# Patient Record
Sex: Male | Born: 1960 | Race: White | Hispanic: No | Marital: Single | Smoking: Current every day smoker
Health system: Southern US, Community
[De-identification: ages and names within clinical notes are randomized; demographics above are authoritative.]

## PROBLEM LIST (undated history)

## (undated) DIAGNOSIS — G4730 Sleep apnea, unspecified: Secondary | ICD-10-CM

## (undated) DIAGNOSIS — F419 Anxiety disorder, unspecified: Secondary | ICD-10-CM

## (undated) DIAGNOSIS — E039 Hypothyroidism, unspecified: Secondary | ICD-10-CM

## (undated) DIAGNOSIS — G8929 Other chronic pain: Secondary | ICD-10-CM

## (undated) DIAGNOSIS — J449 Chronic obstructive pulmonary disease, unspecified: Secondary | ICD-10-CM

## (undated) DIAGNOSIS — I10 Essential (primary) hypertension: Secondary | ICD-10-CM

## (undated) DIAGNOSIS — I700 Atherosclerosis of aorta: Secondary | ICD-10-CM

## (undated) DIAGNOSIS — E785 Hyperlipidemia, unspecified: Secondary | ICD-10-CM

## (undated) HISTORY — PX: CHOLECYSTECTOMY: SHX55

## (undated) HISTORY — DX: Hypothyroidism, unspecified: E03.9

## (undated) HISTORY — PX: HERNIA REPAIR: SHX51

## (undated) HISTORY — PX: APPENDECTOMY: SHX54

## (undated) HISTORY — DX: Atherosclerosis of aorta: I70.0

## (undated) HISTORY — DX: Anxiety disorder, unspecified: F41.9

## (undated) HISTORY — PX: MOUTH SURGERY: SHX715

## (undated) HISTORY — DX: Hyperlipidemia, unspecified: E78.5

---

## 2009-01-24 ENCOUNTER — Emergency Department: Payer: Self-pay | Admitting: Emergency Medicine

## 2016-07-05 HISTORY — PX: SKIN CANCER EXCISION: SHX779

## 2018-11-13 DIAGNOSIS — M5416 Radiculopathy, lumbar region: Secondary | ICD-10-CM | POA: Insufficient documentation

## 2018-11-13 DIAGNOSIS — M546 Pain in thoracic spine: Secondary | ICD-10-CM | POA: Insufficient documentation

## 2020-08-26 ENCOUNTER — Emergency Department: Payer: 59

## 2020-08-26 ENCOUNTER — Other Ambulatory Visit: Payer: Self-pay

## 2020-08-26 ENCOUNTER — Emergency Department
Admission: EM | Admit: 2020-08-26 | Discharge: 2020-08-26 | Discharge status: Against Medical Advice | Payer: 59 | Attending: Emergency Medicine | Admitting: Emergency Medicine

## 2020-08-26 DIAGNOSIS — W1839XA Other fall on same level, initial encounter: Secondary | ICD-10-CM | POA: Diagnosis not present

## 2020-08-26 DIAGNOSIS — R55 Syncope and collapse: Secondary | ICD-10-CM | POA: Diagnosis not present

## 2020-08-26 DIAGNOSIS — I10 Essential (primary) hypertension: Secondary | ICD-10-CM | POA: Insufficient documentation

## 2020-08-26 DIAGNOSIS — R778 Other specified abnormalities of plasma proteins: Secondary | ICD-10-CM | POA: Diagnosis not present

## 2020-08-26 DIAGNOSIS — F17200 Nicotine dependence, unspecified, uncomplicated: Secondary | ICD-10-CM | POA: Insufficient documentation

## 2020-08-26 HISTORY — DX: Essential (primary) hypertension: I10

## 2020-08-26 LAB — CBC WITH DIFFERENTIAL/PLATELET
Abs Immature Granulocytes: 0.09 10*3/uL — ABNORMAL HIGH (ref 0.00–0.07)
Basophils Absolute: 0.1 10*3/uL (ref 0.0–0.1)
Basophils Relative: 0 %
Eosinophils Absolute: 0.1 10*3/uL (ref 0.0–0.5)
Eosinophils Relative: 1 %
HCT: 49.8 % (ref 39.0–52.0)
Hemoglobin: 17.4 g/dL — ABNORMAL HIGH (ref 13.0–17.0)
Immature Granulocytes: 1 %
Lymphocytes Relative: 8 %
Lymphs Abs: 1.2 10*3/uL (ref 0.7–4.0)
MCH: 31.8 pg (ref 26.0–34.0)
MCHC: 34.9 g/dL (ref 30.0–36.0)
MCV: 90.9 fL (ref 80.0–100.0)
Monocytes Absolute: 1.2 10*3/uL — ABNORMAL HIGH (ref 0.1–1.0)
Monocytes Relative: 8 %
Neutro Abs: 11.7 10*3/uL — ABNORMAL HIGH (ref 1.7–7.7)
Neutrophils Relative %: 82 %
Platelets: 173 10*3/uL (ref 150–400)
RBC: 5.48 MIL/uL (ref 4.22–5.81)
RDW: 13.5 % (ref 11.5–15.5)
WBC: 14.4 10*3/uL — ABNORMAL HIGH (ref 4.0–10.5)
nRBC: 0 % (ref 0.0–0.2)

## 2020-08-26 LAB — TROPONIN I (HIGH SENSITIVITY)
Troponin I (High Sensitivity): 17 ng/L (ref <18)
Troponin I (High Sensitivity): 23 ng/L — ABNORMAL HIGH (ref <18)

## 2020-08-26 LAB — URINALYSIS, COMPLETE (UACMP) WITH MICROSCOPIC
Bacteria, UA: NONE SEEN
Bilirubin Urine: NEGATIVE
Glucose, UA: NEGATIVE mg/dL
Ketones, ur: 5 mg/dL — AB
Leukocytes,Ua: NEGATIVE
Nitrite: NEGATIVE
Protein, ur: NEGATIVE mg/dL
Specific Gravity, Urine: 1.009 (ref 1.005–1.030)
Squamous Epithelial / HPF: NONE SEEN (ref 0–5)
pH: 7 (ref 5.0–8.0)

## 2020-08-26 LAB — COMPREHENSIVE METABOLIC PANEL
ALT: 14 U/L (ref 0–44)
AST: 22 U/L (ref 15–41)
Albumin: 3.7 g/dL (ref 3.5–5.0)
Alkaline Phosphatase: 41 U/L (ref 38–126)
Anion gap: 8 (ref 5–15)
BUN: 19 mg/dL (ref 8–23)
CO2: 23 mmol/L (ref 22–32)
Calcium: 8.7 mg/dL — ABNORMAL LOW (ref 8.9–10.3)
Chloride: 108 mmol/L (ref 98–111)
Creatinine, Ser: 1.06 mg/dL (ref 0.61–1.24)
GFR, Estimated: >60 mL/min (ref >60)
Glucose, Bld: 114 mg/dL — ABNORMAL HIGH (ref 70–99)
Potassium: 3.2 mmol/L — ABNORMAL LOW (ref 3.5–5.1)
Sodium: 139 mmol/L (ref 135–145)
Total Bilirubin: 1.1 mg/dL (ref 0.3–1.2)
Total Protein: 6.6 g/dL (ref 6.5–8.1)

## 2020-08-26 MED ORDER — SODIUM CHLORIDE 0.9 % IV BOLUS
500.0000 mL | Freq: Once | INTRAVENOUS | Status: AC
  Administered 2020-08-26: 500 mL via INTRAVENOUS

## 2020-08-26 NOTE — ED Notes (Signed)
Pt diaphoretic upon touch but not obviously visible. Was reported diaphoretic on scene with EMS.

## 2020-08-26 NOTE — ED Notes (Signed)
Pt leaving for CT.  

## 2020-08-26 NOTE — Discharge Instructions (Addendum)
Return to the ER for new, worsening, or persistent severe weakness, dizziness, lightheadedness, recurrent episodes of passing out, any chest pain, difficulty breathing, palpitations, or any other new or worsening symptoms that concern you.  You also may return at anytime if you change your mind and wish to resume your work-up in the emergency department and be admitted.  Your work-up today showed a slightly elevated troponin.  This is an enzyme release by the heart muscle if it is under strain.  You also had an elevated white blood cell count which can be a response to an infection, inflammation, or other stress on the body.  As discussed, you should start to increase your activity level gradually including walking around your home, basic stretches, short trips outside, and progressing to longer walks.  We recommend that you follow-up with your primary care doctor and with a cardiologist.  A referral has been provided.

## 2020-08-26 NOTE — ED Triage Notes (Signed)
Pt in with syncopal episode x2 and seizure-like activity per EMS [place] at mall. 18g R fa placed by EMS. NSR; VSS with EMS; RA; BG 109 with EMS. Pt currently A&Ox4. Resp reg/unlabored. Pt's face flushed. Skin dry.

## 2020-08-26 NOTE — ED Notes (Signed)
Bed locked low. Rails up. Call bell within reach. Urinal attached to bedrail. Pt understands urine sample needed when possible.

## 2020-08-26 NOTE — ED Provider Notes (Signed)
[facility] Emergency Department Provider Note ____________________________________________   Event Date/Time   First MD Initiated Contact with Patient 08/26/20 1628     (approximate)  I have reviewed the triage vital signs and the nursing notes.   HISTORY  Chief Complaint Near Syncope    HPI Isaiah Brown is a 60 y.o. male with PMH as noted below who presents with 2 episodes of syncope.  The patient was shopping at [facility] and states that he started to feel lightheaded and weak.  He tried to get some cold air at one of the freezers, but then continued to feel lightheaded and tried to leave the store.  He then passed out.  He states that he fell to the ground but did not hit his head.  He scratches right hand but denies other injuries.  EMS reports that the patient then had a second syncopal episode in their presence possibly with brief seizure-like activity.  The patient states that he now feels tired but otherwise well.  He states he had an episode like this approximately 6 months ago.  He states that he felt relatively well this morning and yesterday.  He denies any changes in routine, other than not usually going out to a store; he states that because of a lumbar spine problem he has not really been moving around much and it is rare for him to go out.   Past Medical History:  Diagnosis Date  . Hypertension     There are no problems to display for this patient.   History reviewed. No pertinent surgical history.  Prior to Admission medications   Not on File    Allergies Patient has no allergy information on record.  No family history on file.  Social History Social History   Tobacco Use  . Smoking status: Current Every Day Smoker  . Smokeless tobacco: Never Used  Substance Use Topics  . Alcohol use: Not Currently  . Drug use: Never    Review of Systems  Constitutional: No fever. Eyes: No visual changes. ENT: No sore throat.  Positive  for neck pain. Cardiovascular: Denies chest pain. Respiratory: Denies shortness of breath. Gastrointestinal: No vomiting or diarrhea.  Genitourinary: Negative for dysuria.  Musculoskeletal: Negative for back pain. Skin: Negative for rash. Neurological: Negative for headaches, focal weakness or numbness.   ____________________________________________   PHYSICAL EXAM:  VITAL SIGNS: ED Triage Vitals  Enc Vitals Group     BP 08/26/20 1610 (!) 143/87     Pulse Rate 08/26/20 1610 85     Resp 08/26/20 1610 12     Temp 08/26/20 1617 98.3 F (36.8 C)     Temp Source 08/26/20 1617 Oral     SpO2 08/26/20 1610 96 %     Weight 08/26/20 1619 275 lb (124.7 kg)     Height 08/26/20 1619 6\' 1"  (1.854 m)     Head Circumference --      Peak Flow --      Pain Score 08/26/20 1618 3     Pain Loc --      Pain Edu? --      Excl. in GC? --     Constitutional: Alert and oriented.  Somewhat weak appearing but in no acute distress. Eyes: Conjunctivae are normal.  EOMI.  PERRLA. Head: Atraumatic. Nose: No congestion/rhinnorhea. Mouth/Throat: Mucous membranes are moist.   Neck: Normal range of motion.  Mild midline and bilateral paraspinal tenderness. Cardiovascular: Normal rate, regular rhythm.  Good peripheral  circulation. Respiratory: Normal respiratory effort.  No retractions.  Gastrointestinal: Soft and nontender. No distention.  Genitourinary: No flank tenderness. Musculoskeletal: No lower extremity edema.  Extremities warm and well perfused.  Neurologic:  Normal speech and language.  Motor intact in all extremities.  Normal coordination Skin:  Skin is warm and dry. No rash noted. Psychiatric: Mood and affect are normal. Speech and behavior are normal.  ____________________________________________   LABS (all labs ordered are listed, but only abnormal results are displayed)  Labs Reviewed  COMPREHENSIVE METABOLIC PANEL - Abnormal; Notable for the following components:      Result  Value   Potassium 3.2 (*)    Glucose, Bld 114 (*)    Calcium 8.7 (*)    All other components within normal limits  CBC WITH DIFFERENTIAL/PLATELET - Abnormal; Notable for the following components:   WBC 14.4 (*)    Hemoglobin 17.4 (*)    Neutro Abs 11.7 (*)    Monocytes Absolute 1.2 (*)    Abs Immature Granulocytes 0.09 (*)    All other components within normal limits  URINALYSIS, COMPLETE (UACMP) WITH MICROSCOPIC - Abnormal; Notable for the following components:   Color, Urine YELLOW (*)    APPearance CLEAR (*)    Hgb urine dipstick MODERATE (*)    Ketones, ur 5 (*)    All other components within normal limits  TROPONIN I (HIGH SENSITIVITY) - Abnormal; Notable for the following components:   Troponin I (High Sensitivity) 23 (*)    All other components within normal limits  TROPONIN I (HIGH SENSITIVITY)   ____________________________________________  EKG  ED ECG REPORT I, Dionne Bucy, the attending physician, personally viewed and interpreted this ECG.  Date: 08/26/2020 EKG Time: 1613 Rate: 90 Rhythm: normal sinus rhythm QRS Axis: normal Intervals: Incomplete RBBB, LAFB ST/T Wave abnormalities: normal Narrative Interpretation: no evidence of acute ischemia  ____________________________________________  RADIOLOGY  CT head: No ICH or other acute abnormality CT cervical spine: No acute fracture ____________________________________________   PROCEDURES  Procedure(s) performed: No  Procedures  Critical Care performed: No ____________________________________________   INITIAL IMPRESSION / ASSESSMENT AND PLAN / ED COURSE  Pertinent labs & imaging results that were available during my care of the patient were reviewed by me and considered in my medical decision making (see chart for details).  60 year old male with PMH as noted above as well as a history of lumbar spondylolisthesis for which he is currently being worked up by orthopedics presents after 2  episodes of syncope while he was at [place], with a prodrome of lightheadedness and generalized weakness.  The second episode was witnessed by EMS and was brief.  There was possibly some momentary seizure-like activity.  I reviewed the past medical records in Epic and Care Everywhere.  The patient has no prior ED visits or admissions here.  He is currently being evaluated by orthopedics for lumbar spondylolisthesis.  On exam currently, the patient is somewhat tired appearing but in no acute distress.  His vital signs are normal.  Neurologic exam is nonfocal.  The remainder of physical exam is unremarkable.  EKG shows no acute abnormalities.  Differential includes vasovagal episode, dehydration, hypovolemia, electrolyte abnormality, or other sequela of generalized deconditioning, other metabolic cause, or less likely primary cardiac or neurologic etiology.  I have a low suspicion for actual seizure given the lack of any postictal phase.  We will obtain a CT head and cervical spine, lab work-up, give a fluid bolus, and reassess.  ----------------------------------------- 7:53 PM on  08/26/2020 -----------------------------------------  The patient's initial lab work-up was unremarkable except for leukocytosis of unclear etiology.  His urinalysis is normal and there is no evidence of pneumonia or other specific source of infection.  CT head and cervical spine both showed no acute abnormalities.  Repeat troponin was slightly elevated.  Overall, this is of unclear significance.  The patient has no EKG changes, chest pain, or other symptoms to suggest ACS.  I recommended admission for trending of the troponin, cardiac monitoring, ACS rule out, although my suspicion for acute cardiac issue is low.  However, the patient states that he does not want to be admitted to the hospital if at all possible.  I also offered to observe for another few hours and do a third troponin to make sure it is not trending  up further, but he declined this as well.  The patient elects to go home.  I had an extensive discussion with the patient about the possible etiologies of his syncope and about the results of the work-up.  I explained that a rising troponin could indicate damage to the heart, and impending heart attack, or a possible cardiac cause of him passing out.  Risks include heart attack, permanent heart damage, permanent disability and death.  The patient was able to paraphrase [place] back to me.  He demonstrates appropriate understanding, and full decision-making capacity.  He will sign AGAINST MEDICAL ADVICE.  Return precautions given, and he expresses understanding.  He also understands he may return at anytime if he changes his mind and wishes to resume his work-up.    ____________________________________________   FINAL CLINICAL IMPRESSION(S) / ED DIAGNOSES  Final diagnoses:  Syncope, unspecified syncope type  Elevated troponin      NEW MEDICATIONS STARTED DURING THIS VISIT:  There are no discharge medications for this patient.    Note:  This document was prepared using Dragon voice recognition software and may include unintentional dictation errors.   Dionne Bucy, MD 08/26/20 2241

## 2020-10-01 ENCOUNTER — Ambulatory Visit (INDEPENDENT_AMBULATORY_CARE_PROVIDER_SITE_OTHER): Payer: 59 | Admitting: Internal Medicine

## 2020-10-01 ENCOUNTER — Encounter: Payer: Self-pay | Admitting: Internal Medicine

## 2020-10-01 ENCOUNTER — Other Ambulatory Visit: Payer: Self-pay

## 2020-10-01 VITALS — BP 160/98 | HR 84 | Temp 98.9°F | Ht 72.21 in | Wt 263.0 lb

## 2020-10-01 DIAGNOSIS — R55 Syncope and collapse: Secondary | ICD-10-CM

## 2020-10-01 DIAGNOSIS — Z125 Encounter for screening for malignant neoplasm of prostate: Secondary | ICD-10-CM

## 2020-10-01 DIAGNOSIS — T671XXD Heat syncope, subsequent encounter: Secondary | ICD-10-CM

## 2020-10-01 LAB — URINALYSIS, ROUTINE W REFLEX MICROSCOPIC
Bilirubin, UA: NEGATIVE
Glucose, UA: NEGATIVE
Leukocytes,UA: NEGATIVE
Nitrite, UA: NEGATIVE
Specific Gravity, UA: >1.03 — ABNORMAL HIGH (ref 1.005–1.030)
Urobilinogen, Ur: 1 mg/dL (ref 0.2–1.0)
pH, UA: 6 (ref 5.0–7.5)

## 2020-10-01 LAB — MICROSCOPIC EXAMINATION
Bacteria, UA: NONE SEEN
WBC, UA: NONE SEEN /hpf (ref 0–5)

## 2020-10-01 LAB — BAYER DCA HB A1C WAIVED: HB A1C (BAYER DCA - WAIVED): 5 % (ref <7.0)

## 2020-10-01 MED ORDER — NICOTINE 21 MG/24HR TD PT24: 21.0000 mg | MEDICATED_PATCH | Freq: Every day | TRANSDERMAL | 1 refills | Status: DC

## 2020-10-01 NOTE — Progress Notes (Signed)
BP (!) 160/98   Pulse 84   Temp 98.9 F (37.2 C) (Oral)   Ht 6' 0.21" (1.834 m)   Wt 263 lb (119.3 kg)   SpO2 95%   BMI 35.47 kg/m    Subjective:    Patient ID: Isaiah Brown, male    DOB: [birth year], 60 y.o.   MRN: [id number]  HPI: Isaiah Brown is a 61 y.o. male  Pt is New to the practice, was walking while shopping felt lightheaded, jittery legs were wobbly x 08/06/20   [place] moment [place] , started to sweat went to the ER via EMS - was rx muscle relaxants for his back pain has a ho lumbar DDD, Lower back pain , has had Injections in 2020 for such sees [facility] , has been on pain meds is on tramadol and methocarbamol . Has an "addictive personality " per his verbal record - worse was ETOH abuse drank a lot in the past quit 18 yrs ago none since then. Is a current smoker Has some chest tighntess in left arm and left side of chest, not currently of note troponins have been elevated in the ER pt signed out AMA and didn't want to have repeat checks/ Admission to the hosp.    Nicotine Dependence Presents for initial visit. Symptoms are negative for decreased concentration and fatigue. Preferred tobacco types include cigars. His urge triggers include company of smokers. (1 ppd increased to 2-3 ppd) He smokes 3 packs of cigarettes per day. He started smoking when he was 55-55 years old. Isaiah Brown is thinking about quitting.  Loss of Consciousness This is a new (was walking while shopping felt lightheaded, jittery legs were wobbly x 08/06/20   [place] moment [place] , started to sweat went to the ER via EMS - was rx muscle relaxants for his back pain has a ho lumbar DDD,) problem. Pertinent negatives include no abdominal pain, chest pain, confusion, dizziness, fever, headaches, light-headedness, nausea, palpitations, vomiting or weakness.    Chief Complaint  Patient presents with  . New Patient (Initial Visit)    Had 2 episodes of syncope. He felt week and  jittery early in Feb. 22, 2022    Relevant past medical, surgical, family and social history reviewed and updated as indicated. Interim medical history since our last visit reviewed. Allergies and medications reviewed and updated.  Review of Systems  Constitutional: Negative for activity change, appetite change, chills, fatigue and fever.  HENT: Negative for congestion, ear discharge, ear pain and facial swelling.   Eyes: Negative for pain, discharge and itching.  Respiratory: Negative for cough, chest tightness, shortness of breath and wheezing.   Cardiovascular: Negative for chest pain, palpitations and leg swelling.  Gastrointestinal: Negative for abdominal distention, abdominal pain, blood in stool, constipation, diarrhea, nausea and vomiting.  Endocrine: Negative for cold intolerance, heat intolerance, polydipsia, polyphagia and polyuria.  Genitourinary: Negative for difficulty urinating, dysuria, flank pain, frequency, hematuria and urgency.  Musculoskeletal: Negative for arthralgias, gait problem, joint swelling and myalgias.  Skin: Negative for color change, rash and wound.  Neurological: Negative for dizziness, tremors, speech difficulty, weakness, light-headedness, numbness and headaches.  Hematological: Does not bruise/bleed easily.  Psychiatric/Behavioral: Negative for agitation, confusion, decreased concentration, sleep disturbance and suicidal ideas.    Per HPI unless specifically indicated above     Objective:    BP (!) 160/98   Pulse 84   Temp 98.9 F (37.2 C) (Oral)   Ht 6'  0.21" (1.834 m)   Wt 263 lb (119.3 kg)   SpO2 95%   BMI 35.47 kg/m   Wt Readings from Last 3 Encounters:  10/01/20 263 lb (119.3 kg)  08/26/20 275 lb (124.7 kg)    Physical Exam Vitals and nursing note reviewed.  Constitutional:      General: He is not in acute distress.    Appearance: Normal appearance. He is not ill-appearing or diaphoretic.  HENT:     Head: Normocephalic and  atraumatic.     Right Ear: Tympanic membrane and external ear normal. There is no impacted cerumen.     Left Ear: External ear normal.     Nose: No congestion or rhinorrhea.     Mouth/Throat:     Pharynx: No oropharyngeal exudate or posterior oropharyngeal erythema.  Eyes:     Conjunctiva/sclera: Conjunctivae normal.     Pupils: Pupils are equal, round, and reactive to light.  Cardiovascular:     Rate and Rhythm: Normal rate and regular rhythm.     Heart sounds: No murmur heard. No friction rub. No gallop.   Pulmonary:     Effort: No respiratory distress.     Breath sounds: No stridor. No wheezing or rhonchi.  Chest:     Chest wall: No tenderness.  Abdominal:     General: Abdomen is flat. Bowel sounds are normal.     Palpations: Abdomen is soft. There is no mass.     Tenderness: There is no abdominal tenderness.  Musculoskeletal:     Cervical back: Normal range of motion and neck supple. No rigidity or tenderness.     Left lower leg: No edema.  Skin:    General: Skin is warm and dry.  Neurological:     Mental Status: He is alert.     Results for orders placed or performed during the hospital encounter of 08/26/20  Comprehensive metabolic panel  Result Value Ref Range   Sodium 139 135 - 145 mmol/L   Potassium 3.2 (L) 3.5 - 5.1 mmol/L   Chloride 108 98 - 111 mmol/L   CO2 23 22 - 32 mmol/L   Glucose, Bld 114 (H) 70 - 99 mg/dL   BUN 19 8 - 23 mg/dL   Creatinine, Ser 8.67 0.61 - 1.24 mg/dL   Calcium 8.7 (L) 8.9 - 10.3 mg/dL   Total Protein 6.6 6.5 - 8.1 g/dL   Albumin 3.7 3.5 - 5.0 g/dL   AST 22 15 - 41 U/L   ALT 14 0 - 44 U/L   Alkaline Phosphatase 41 38 - 126 U/L   Total Bilirubin 1.1 0.3 - 1.2 mg/dL   GFR, Estimated >67 >20 mL/min   Anion gap 8 5 - 15  CBC with Differential  Result Value Ref Range   WBC 14.4 (H) 4.0 - 10.5 K/uL   RBC 5.48 4.22 - 5.81 MIL/uL   Hemoglobin 17.4 (H) 13.0 - 17.0 g/dL   HCT 94.7 09.6 - 28.3 %   MCV 90.9 80.0 - 100.0 fL   MCH 31.8  26.0 - 34.0 pg   MCHC 34.9 30.0 - 36.0 g/dL   RDW 66.2 94.7 - 65.4 %   Platelets 173 150 - 400 K/uL   nRBC 0.0 0.0 - 0.2 %   Neutrophils Relative % 82 %   Neutro Abs 11.7 (H) 1.7 - 7.7 K/uL   Lymphocytes Relative 8 %   Lymphs Abs 1.2 0.7 - 4.0 K/uL   Monocytes Relative 8 %   Monocytes  Absolute 1.2 (H) 0.1 - 1.0 K/uL   Eosinophils Relative 1 %   Eosinophils Absolute 0.1 0.0 - 0.5 K/uL   Basophils Relative 0 %   Basophils Absolute 0.1 0.0 - 0.1 K/uL   Immature Granulocytes 1 %   Abs Immature Granulocytes 0.09 (H) 0.00 - 0.07 K/uL  Urinalysis, Complete w Microscopic  Result Value Ref Range   Color, Urine YELLOW (A) YELLOW   APPearance CLEAR (A) CLEAR   Specific Gravity, Urine 1.009 1.005 - 1.030   pH 7.0 5.0 - 8.0   Glucose, UA NEGATIVE NEGATIVE mg/dL   Hgb urine dipstick MODERATE (A) NEGATIVE   Bilirubin Urine NEGATIVE NEGATIVE   Ketones, ur 5 (A) NEGATIVE mg/dL   Protein, ur NEGATIVE NEGATIVE mg/dL   Nitrite NEGATIVE NEGATIVE   Leukocytes,Ua NEGATIVE NEGATIVE   RBC / HPF 11-20 0 - 5 RBC/hpf   WBC, UA 0-5 0 - 5 WBC/hpf   Bacteria, UA NONE SEEN NONE SEEN   Squamous Epithelial / LPF NONE SEEN 0 - 5   Mucus PRESENT   Troponin I (High Sensitivity)  Result Value Ref Range   Troponin I (High Sensitivity) 17 <18 ng/L  Troponin I (High Sensitivity)  Result Value Ref Range   Troponin I (High Sensitivity) 23 (H) <18 ng/L        Current Outpatient Medications:  .  celecoxib (CELEBREX) 200 MG capsule, celecoxib 200 mg capsule  TAKE 1 CAPSULE BY MOUTH ONCE DAILY FOR 30 DAYS, Disp: , Rfl:  .  ibuprofen (ADVIL) 200 MG tablet, Take by mouth., Disp: , Rfl:  .  methocarbamol (ROBAXIN) 750 MG tablet, methocarbamol 750 mg tablet  TAKE 1 TO 2 TABLETS BY MOUTH THREE TIMES DAILY, Disp: , Rfl:     Assessment & Plan:  1. Syncope ? Cardiac  Will refer to cards Stop methocarbamol  hasnt used tramadol since rx   2. Nicotine abuse Smoking cessation advised.start pt on nicotine patches in  the past. continues to smoke. more than > 5 - 10 mins of time was spent with pt regarding smoking cessation and complications.  Consider Lung cancer screening with Low dose CT chest.    3. Back pain chronic stable, will need to fu with ortho for such  Continue NSAIDS as rx by ortho  Stop / hold methocarbamol as dose recently increased ?cause of syncope.   4. ETOH abuse in the past LFts wnl from Er Will check   5. Leukocytosis not addressed in ER ? Etiology  Recheck  Consider CXR  Check UA   6. Hb 17 ? Sec to chronic smoking h.o ? MDS will recheck consdier heme onc referal if continues to go higher.    Problem List Items Addressed This Visit   None      Follow up plan: No follow-ups on file.

## 2020-10-02 ENCOUNTER — Ambulatory Visit: Payer: Medicaid Other

## 2020-10-02 ENCOUNTER — Ambulatory Visit (INDEPENDENT_AMBULATORY_CARE_PROVIDER_SITE_OTHER): Payer: 59 | Admitting: Internal Medicine

## 2020-10-02 ENCOUNTER — Encounter: Payer: Self-pay | Admitting: Internal Medicine

## 2020-10-02 VITALS — BP 153/97 | HR 69 | Temp 98.9°F | Ht 72.21 in | Wt 263.0 lb

## 2020-10-02 DIAGNOSIS — R319 Hematuria, unspecified: Secondary | ICD-10-CM

## 2020-10-02 DIAGNOSIS — R7989 Other specified abnormal findings of blood chemistry: Secondary | ICD-10-CM | POA: Diagnosis not present

## 2020-10-02 LAB — COMPREHENSIVE METABOLIC PANEL
ALT: 11 IU/L (ref 0–44)
AST: 22 IU/L (ref 0–40)
Albumin/Globulin Ratio: 1.7 (ref 1.2–2.2)
Albumin: 4.8 g/dL (ref 3.8–4.8)
Alkaline Phosphatase: 73 IU/L (ref 44–121)
BUN/Creatinine Ratio: 13 (ref 10–24)
BUN: 14 mg/dL (ref 8–27)
Bilirubin Total: 0.7 mg/dL (ref 0.0–1.2)
CO2: 16 mmol/L — ABNORMAL LOW (ref 20–29)
Calcium: 9.6 mg/dL (ref 8.6–10.2)
Chloride: 104 mmol/L (ref 96–106)
Creatinine, Ser: 1.1 mg/dL (ref 0.76–1.27)
Globulin, Total: 2.8 g/dL (ref 1.5–4.5)
Glucose: 89 mg/dL (ref 65–99)
Potassium: 3.9 mmol/L (ref 3.5–5.2)
Sodium: 142 mmol/L (ref 134–144)
Total Protein: 7.6 g/dL (ref 6.0–8.5)
eGFR: 76 mL/min/{1.73_m2} (ref >59)

## 2020-10-02 LAB — CBC WITH DIFFERENTIAL/PLATELET
Basophils Absolute: 0.1 10*3/uL (ref 0.0–0.2)
Basos: 0 %
EOS (ABSOLUTE): 0.1 10*3/uL (ref 0.0–0.4)
Eos: 1 %
Hematocrit: 55.2 % — ABNORMAL HIGH (ref 37.5–51.0)
Hemoglobin: 19.7 g/dL — ABNORMAL HIGH (ref 13.0–17.7)
Immature Grans (Abs): 0 10*3/uL (ref 0.0–0.1)
Immature Granulocytes: 0 %
Lymphocytes Absolute: 1.3 10*3/uL (ref 0.7–3.1)
Lymphs: 12 %
MCH: 31.7 pg (ref 26.6–33.0)
MCHC: 35.7 g/dL (ref 31.5–35.7)
MCV: 89 fL (ref 79–97)
Monocytes Absolute: 1 10*3/uL — ABNORMAL HIGH (ref 0.1–0.9)
Monocytes: 9 %
Neutrophils Absolute: 8.8 10*3/uL — ABNORMAL HIGH (ref 1.4–7.0)
Neutrophils: 78 %
Platelets: 208 10*3/uL (ref 150–450)
RBC: 6.21 x10E6/uL — ABNORMAL HIGH (ref 4.14–5.80)
RDW: 13.3 % (ref 11.6–15.4)
WBC: 11.3 10*3/uL — ABNORMAL HIGH (ref 3.4–10.8)

## 2020-10-02 LAB — LIPID PANEL
Chol/HDL Ratio: 5.7 ratio — ABNORMAL HIGH (ref 0.0–5.0)
Cholesterol, Total: 235 mg/dL — ABNORMAL HIGH (ref 100–199)
HDL: 41 mg/dL (ref >39)
LDL Chol Calc (NIH): 167 mg/dL — ABNORMAL HIGH (ref 0–99)
Triglycerides: 149 mg/dL (ref 0–149)
VLDL Cholesterol Cal: 27 mg/dL (ref 5–40)

## 2020-10-02 LAB — PSA: Prostate Specific Ag, Serum: 0.5 ng/mL (ref 0.0–4.0)

## 2020-10-02 LAB — TSH: TSH: 22.7 u[IU]/mL — ABNORMAL HIGH (ref 0.450–4.500)

## 2020-10-02 MED ORDER — BUPROPION HCL ER (SR) 100 MG PO TB12: 100.0000 mg | ORAL_TABLET | Freq: Every day | ORAL | 1 refills | Status: DC

## 2020-10-02 NOTE — Progress Notes (Addendum)
BP (!) 153/97   Pulse 69   Temp 98.9 F (37.2 C) (Oral)   Ht 6' 0.21" (1.834 m)   Wt 263 lb (119.3 kg)   SpO2 96%   BMI 35.47 kg/m    Subjective:    Patient ID: Isaiah Brown, male    DOB: 1961/02/26, 60 y.o.   MRN: [id number]  HPI: Alexavier Tsutsui is a 60 y.o. male  Thyroid Problem Presents for initial visit. Symptoms include anxiety, dry skin, fatigue, hair loss, heat intolerance, leg swelling, nail problem and weight gain. Patient reports no cold intolerance, constipation, depressed mood, diarrhea, hoarse voice, menstrual problem, palpitations, tremors or weight loss. (Loose stools sometimes.  Hair loss ++  Dry skin )  Hematuria He describes the hematuria as microscopic hematuria. Irritative symptoms include frequency, nocturia and urgency. Obstructive symptoms include dribbling, incomplete emptying, an intermittent stream, a slower stream, straining and a weak stream. Associated symptoms include flank pain. Pertinent negatives include no abdominal pain, bladder pain, dysuria, fever, urinary retention, vomiting or weight loss.    Chief Complaint  Patient presents with  . Lab work    Patient is here to go over lab work    Relevant past medical, surgical, family and social history reviewed and updated as indicated. Interim medical history since our last visit reviewed. Allergies and medications reviewed and updated.  Review of Systems  Constitutional: Positive for fatigue and weight gain. Negative for fever and weight loss.  HENT: Negative for hoarse voice.   Cardiovascular: Negative for palpitations.  Gastrointestinal: Negative for abdominal pain, constipation, diarrhea and vomiting.  Endocrine: Positive for heat intolerance. Negative for cold intolerance.  Genitourinary: Positive for flank pain, frequency, hematuria, incomplete emptying, nocturia and urgency. Negative for dysuria and menstrual problem.  Neurological: Negative for tremors.  Psychiatric/Behavioral: The  patient is nervous/anxious.     Per HPI unless specifically indicated above     Objective:    BP (!) 153/97   Pulse 69   Temp 98.9 F (37.2 C) (Oral)   Ht 6' 0.21" (1.834 m)   Wt 263 lb (119.3 kg)   SpO2 96%   BMI 35.47 kg/m   Wt Readings from Last 3 Encounters:  10/02/20 263 lb (119.3 kg)  10/01/20 263 lb (119.3 kg)  08/26/20 275 lb (124.7 kg)    Physical Exam Vitals and nursing note reviewed.  Constitutional:      General: He is not in acute distress.    Appearance: Normal appearance. He is not ill-appearing or diaphoretic.  HENT:     Head: Normocephalic and atraumatic.     Right Ear: Tympanic membrane and external ear normal. There is no impacted cerumen.     Left Ear: External ear normal.     Nose: No congestion or rhinorrhea.     Mouth/Throat:     Pharynx: No oropharyngeal exudate or posterior oropharyngeal erythema.  Eyes:     Conjunctiva/sclera: Conjunctivae normal.     Pupils: Pupils are equal, round, and reactive to light.  Cardiovascular:     Rate and Rhythm: Normal rate and regular rhythm.     Heart sounds: No murmur heard. No friction rub. No gallop.   Pulmonary:     Effort: No respiratory distress.     Breath sounds: No stridor. No wheezing or rhonchi.  Chest:     Chest wall: No tenderness.  Abdominal:     General: Abdomen is flat. Bowel sounds are normal. There is no distension.     Palpations: Abdomen  is soft. There is no mass.     Tenderness: There is no abdominal tenderness. There is no guarding.  Musculoskeletal:        General: No swelling or deformity.     Cervical back: Normal range of motion and neck supple. No rigidity or tenderness.     Right lower leg: No edema.     Left lower leg: No edema.  Skin:    General: Skin is warm and dry.     Coloration: Skin is not jaundiced.     Findings: No erythema.  Neurological:     Mental Status: He is alert and oriented to person, place, and time. Mental status is at baseline.  Psychiatric:         Mood and Affect: Mood normal.        Behavior: Behavior normal.        Thought Content: Thought content normal.        Judgment: Judgment normal.     Results for orders placed or performed in visit on 10/01/20  Microscopic Examination   Urine  Result Value Ref Range   WBC, UA None seen 0 - 5 /hpf   RBC 3-10 (A) 0 - 2 /hpf   Epithelial Cells (non renal) 0-10 0 - 10 /hpf   Mucus, UA Present (A) Not Estab.   Bacteria, UA None seen None seen/Few  TSH  Result Value Ref Range   TSH 22.700 (H) 0.450 - 4.500 uIU/mL  PSA  Result Value Ref Range   Prostate Specific Ag, Serum 0.5 0.0 - 4.0 ng/mL  Lipid panel  Result Value Ref Range   Cholesterol, Total 235 (H) 100 - 199 mg/dL   Triglycerides 149 0 - 149 mg/dL   HDL 41 >39 mg/dL   VLDL Cholesterol Cal 27 5 - 40 mg/dL   LDL Chol Calc (NIH) 167 (H) 0 - 99 mg/dL   Chol/HDL Ratio 5.7 (H) 0.0 - 5.0 ratio  CBC with Differential/Platelet  Result Value Ref Range   WBC 11.3 (H) 3.4 - 10.8 x10E3/uL   RBC 6.21 (H) 4.14 - 5.80 x10E6/uL   Hemoglobin 19.7 (H) 13.0 - 17.7 g/dL   Hematocrit 55.2 (H) 37.5 - 51.0 %   MCV 89 79 - 97 fL   MCH 31.7 26.6 - 33.0 pg   MCHC 35.7 31.5 - 35.7 g/dL   RDW 13.3 11.6 - 15.4 %   Platelets 208 150 - 450 x10E3/uL   Neutrophils 78 Not Estab. %   Lymphs 12 Not Estab. %   Monocytes 9 Not Estab. %   Eos 1 Not Estab. %   Basos 0 Not Estab. %   Neutrophils Absolute 8.8 (H) 1.4 - 7.0 x10E3/uL   Lymphocytes Absolute 1.3 0.7 - 3.1 x10E3/uL   Monocytes Absolute 1.0 (H) 0.1 - 0.9 x10E3/uL   EOS (ABSOLUTE) 0.1 0.0 - 0.4 x10E3/uL   Basophils Absolute 0.1 0.0 - 0.2 x10E3/uL   Immature Granulocytes 0 Not Estab. %   Immature Grans (Abs) 0.0 0.0 - 0.1 x10E3/uL  Comprehensive metabolic panel  Result Value Ref Range   Glucose 89 65 - 99 mg/dL   BUN 14 8 - 27 mg/dL   Creatinine, Ser 1.10 0.76 - 1.27 mg/dL   eGFR 76 >59 mL/min/1.73   BUN/Creatinine Ratio 13 10 - 24   Sodium 142 134 - 144 mmol/L   Potassium 3.9 3.5 - 5.2  mmol/L   Chloride 104 96 - 106 mmol/L   CO2 16 (L) 20 -  29 mmol/L   Calcium 9.6 8.6 - 10.2 mg/dL   Total Protein 7.6 6.0 - 8.5 g/dL   Albumin 4.8 3.8 - 4.8 g/dL   Globulin, Total 2.8 1.5 - 4.5 g/dL   Albumin/Globulin Ratio 1.7 1.2 - 2.2   Bilirubin Total 0.7 0.0 - 1.2 mg/dL   Alkaline Phosphatase 73 44 - 121 IU/L   AST 22 0 - 40 IU/L   ALT 11 0 - 44 IU/L  Bayer DCA Hb A1c Waived (STAT)  Result Value Ref Range   HB A1C (BAYER DCA - WAIVED) 5.0 <7.0 %  Urinalysis, Routine w reflex microscopic  Result Value Ref Range   Specific Gravity, UA >1.030 (H) 1.005 - 1.030   pH, UA 6.0 5.0 - 7.5   Color, UA Yellow Yellow   Appearance Ur Clear Clear   Leukocytes,UA Negative Negative   Protein,UA 2+ (A) Negative/Trace   Glucose, UA Negative Negative   Ketones, UA Trace (A) Negative   RBC, UA 3+ (A) Negative   Bilirubin, UA Negative Negative   Urobilinogen, Ur 1.0 0.2 - 1.0 mg/dL   Nitrite, UA Negative Negative   Microscopic Examination See below:         Current Outpatient Medications:  .  methocarbamol (ROBAXIN) 750 MG tablet, methocarbamol 750 mg tablet  TAKE 1 TO 2 TABLETS BY MOUTH THREE TIMES DAILY, Disp: , Rfl:  .  nicotine (NICODERM CQ) 21 mg/24hr patch, Place 1 patch (21 mg total) onto the skin daily., Disp: 28 patch, Rfl: 1    Assessment & Plan:  1. Elevated tsh Orders Placed This Encounter  Procedures  . US THYROID    Reason for Referral:  Has the referral been discussed with the patient?:   Designated contact for the referral if not the patient (name/phone number):   Has the patient seen a specialist for this issue before?:  If so, who (practice/provider)?  Does the patient have a provider or location preference for the referral?: hosp Would the patient like to see previous specialist if applicable?    Standing Status:   Future    Standing Expiration Date:   12/02/2020    Order Specific Question:   Reason for Exam (SYMPTOM  OR DIAGNOSIS REQUIRED)    Answer:    elevated TSH    Order Specific Question:   Preferred imaging location?    Answer:   [facility]  . T4, free  . T3, free  . TSH    2. Elevated Hb  From smoking ?  Polycythemia sec   3. Nicotine dependence Smoking cessation advised. Start pt on welbutrin  nicotine patches in the past. continues to smoke. more than > 5 - 10 mins of time was spent with pt regarding smoking cessation and complications.      Problem List Items Addressed This Visit   None   Visit Diagnoses    Elevated TSH    -  Primary   Relevant Orders   T4, free   US THYROID   T3, free   TSH       Follow up plan: No follow-ups on file.

## 2020-10-02 NOTE — Addendum Note (Signed)
Addended byLoura Pardon on: 10/02/2020 01:31 PM   Modules accepted: Orders

## 2020-10-03 LAB — T3, FREE: T3, Free: 2.7 pg/mL (ref 2.0–4.4)

## 2020-10-03 LAB — T4, FREE: Free T4: 0.92 ng/dL (ref 0.82–1.77)

## 2020-10-03 LAB — TSH: TSH: 15.8 u[IU]/mL — ABNORMAL HIGH (ref 0.450–4.500)

## 2020-10-04 LAB — URINE CULTURE

## 2020-10-08 ENCOUNTER — Ambulatory Visit: Payer: Medicaid Other | Admitting: Internal Medicine

## 2020-10-09 ENCOUNTER — Ambulatory Visit: Payer: Self-pay | Admitting: *Deleted

## 2020-10-09 NOTE — Telephone Encounter (Signed)
Appt asap

## 2020-10-09 NOTE — Telephone Encounter (Signed)
Called pt scheduled with Isaiah Brown tomorrow he states he is scheduled to see cardiologist Monday

## 2020-10-09 NOTE — Telephone Encounter (Signed)
Reall yshould go to er but if wont needs to be seen ASAP

## 2020-10-09 NOTE — Telephone Encounter (Signed)
Pt reports BP at noon 189/114   This AM 172/116, asked to check during call, 196/116  HR 72. States "I think it's from the nicotine patch I started." Reports "Mild chest tightness, into left arm but not bad." States "The left arm is where I place my patch, site's a bit red too." Chest tightness, "Not pain." Advised ED, pt states "I won't go at this point." States "Just wondering if Dr. Charlotta Newton would like to order a BP med." Again reiterated need for ED eval. Declines. States if worsens I'll call EMS."  Assured NT would route to practice for PCPs review.  Please advise: CB# [phone]  Reason for Disposition . [1] Systolic BP  >= 160 OR Diastolic >= 100 AND [2] cardiac or neurologic symptoms (e.g., chest pain, difficulty breathing, unsteady gait, blurred vision)  Answer Assessment - Initial Assessment Questions 1. BLOOD PRESSURE: "What is the blood pressure?" "Did you take at least two measurements 5 minutes apart?"     During call 196/116  HR 72 2. ONSET: "When did you take your blood pressure?"     During call 3. HOW: "How did you obtain the blood pressure?" (e.g., visiting nurse, automatic home BP monitor)    Home monitor 4. HISTORY: "Do you have a history of high blood pressure?"    No 5. MEDICATIONS: "Are you taking any medications for blood pressure?" "Have you missed any doses recently?"    N/A 6. OTHER SYMPTOMS: "Do you have any symptoms?" (e.g., headache, chest pain, blurred vision, difficulty breathing, weakness)    "Mild chest tighness."  Protocols used: BLOOD PRESSURE - HIGH-A-AH

## 2020-10-10 ENCOUNTER — Ambulatory Visit (INDEPENDENT_AMBULATORY_CARE_PROVIDER_SITE_OTHER): Payer: 59 | Admitting: Nurse Practitioner

## 2020-10-10 ENCOUNTER — Encounter: Payer: Self-pay | Admitting: Nurse Practitioner

## 2020-10-10 ENCOUNTER — Other Ambulatory Visit: Payer: Self-pay

## 2020-10-10 VITALS — BP 180/108 | HR 74 | Temp 98.9°F | Wt 263.5 lb

## 2020-10-10 DIAGNOSIS — R0789 Other chest pain: Secondary | ICD-10-CM | POA: Diagnosis not present

## 2020-10-10 DIAGNOSIS — I10 Essential (primary) hypertension: Secondary | ICD-10-CM | POA: Insufficient documentation

## 2020-10-10 MED ORDER — VALSARTAN-HYDROCHLOROTHIAZIDE 80-12.5 MG PO TABS
1.0000 | ORAL_TABLET | Freq: Every day | ORAL | 0 refills | Status: DC
Start: 2020-10-10 — End: 2020-11-06

## 2020-10-10 MED ORDER — TRIAMCINOLONE ACETONIDE 0.025 % EX OINT: 1.0000 "application " | TOPICAL_OINTMENT | Freq: Two times a day (BID) | CUTANEOUS | 0 refills | Status: DC

## 2020-10-10 NOTE — Progress Notes (Signed)
BP (!) 180/108   Pulse 74   Temp 98.9 F (37.2 C)   Wt 263 lb 8 oz (119.5 kg)   SpO2 95%   BMI 35.53 kg/m    Subjective:    Patient ID: Isaiah Brown, male    DOB: 07-09-1960, 60 y.o.   MRN: [id number]  HPI: Isaiah Brown is a 60 y.o. male  Chief Complaint  Patient presents with  . Hypertension    Chest tightness   CHEST TIGHTNESS Patient presents to clinic today after calling yesterday and having chest tightness.  It was recommended that he be seen in the ER.  Patient declined and decided to come into the office for an appointment.  Blood pressures at home were 189/114, 176/116, and 196/116.  Patient was started on the nicotine patch recently which caused a rash on his arm.  Patient was also started on Wellbutrin.   Denies HA, CP, SOB, dizziness, palpitations, visual changes, and lower extremity swelling in office today.    Relevant past medical, surgical, family and social history reviewed and updated as indicated. Interim medical history since our last visit reviewed. Allergies and medications reviewed and updated.  Review of Systems  Eyes: Negative for visual disturbance.  Respiratory: Positive for chest tightness. Negative for shortness of breath.   Cardiovascular: Negative for chest pain and leg swelling.  Neurological: Negative for light-headedness and headaches.    Per HPI unless specifically indicated above     Objective:    BP (!) 180/108   Pulse 74   Temp 98.9 F (37.2 C)   Wt 263 lb 8 oz (119.5 kg)   SpO2 95%   BMI 35.53 kg/m   Wt Readings from Last 3 Encounters:  10/10/20 263 lb 8 oz (119.5 kg)  10/02/20 263 lb (119.3 kg)  10/01/20 263 lb (119.3 kg)    Physical Exam Vitals and nursing note reviewed.  Constitutional:      General: He is not in acute distress.    Appearance: Normal appearance. He is obese. He is not ill-appearing, toxic-appearing or diaphoretic.  HENT:     Head: Normocephalic.     Right Ear: External ear normal.     Left Ear:  External ear normal.     Nose: Nose normal. No congestion or rhinorrhea.     Mouth/Throat:     Mouth: Mucous membranes are moist.  Eyes:     General:        Right eye: No discharge.        Left eye: No discharge.     Extraocular Movements: Extraocular movements intact.     Conjunctiva/sclera: Conjunctivae normal.     Pupils: Pupils are equal, round, and reactive to light.  Cardiovascular:     Rate and Rhythm: Normal rate and regular rhythm.     Heart sounds: No murmur heard.   Pulmonary:     Effort: Pulmonary effort is normal. No respiratory distress.     Breath sounds: Normal breath sounds. No wheezing, rhonchi or rales.  Abdominal:     General: Abdomen is flat. Bowel sounds are normal.  Musculoskeletal:     Cervical back: Normal range of motion and neck supple.  Skin:    General: Skin is warm and dry.     Capillary Refill: Capillary refill takes less than 2 seconds.  Neurological:     General: No focal deficit present.     Mental Status: He is alert and oriented to person, place, and time.  Psychiatric:  Mood and Affect: Mood normal.        Behavior: Behavior normal.        Thought Content: Thought content normal.        Judgment: Judgment normal.     Results for orders placed or performed in visit on 10/02/20  T4, free  Result Value Ref Range   Free T4 0.92 0.82 - 1.77 ng/dL  T3, free  Result Value Ref Range   T3, Free 2.7 2.0 - 4.4 pg/mL  TSH  Result Value Ref Range   TSH 15.800 (H) 0.450 - 4.500 uIU/mL      Assessment & Plan:   Problem List Items Addressed This Visit      Cardiovascular and Mediastinum   Primary hypertension - Primary    Begin Valsartan/ HCTZ. Side effects and benefits of medication discussed. DASH diet, smoking cessation, and exercise discussed. Patient sees Cardiology on Monday.  Stop Wellbutrin and Nicotine patch due those causing elevated blood pressures.  Keep follow up with PCP in 10 days.       Relevant Medications    valsartan-hydrochlorothiazide (DIOVAN-HCT) 80-12.5 MG tablet    Other Visit Diagnoses    Chest tightness       EKG if office normal. Reviewed when to return to office or seek attention in ER.   Relevant Orders   EKG 12-Lead (Completed)       Follow up plan: Return for Keep follow up with PCP on April 18.Marland Kitchen

## 2020-10-10 NOTE — Patient Instructions (Signed)

## 2020-10-10 NOTE — Assessment & Plan Note (Signed)
Begin Valsartan/ HCTZ. Side effects and benefits of medication discussed. DASH diet, smoking cessation, and exercise discussed. Patient sees Cardiology on Monday.  Stop Wellbutrin and Nicotine patch due those causing elevated blood pressures.  Keep follow up with PCP in 10 days.

## 2020-10-10 NOTE — Progress Notes (Signed)
NSR. Results discussed with patient during visit today.

## 2020-10-13 ENCOUNTER — Ambulatory Visit (INDEPENDENT_AMBULATORY_CARE_PROVIDER_SITE_OTHER): Payer: 59 | Admitting: Cardiology

## 2020-10-13 ENCOUNTER — Other Ambulatory Visit: Payer: Self-pay

## 2020-10-13 ENCOUNTER — Encounter: Payer: Self-pay | Admitting: Cardiology

## 2020-10-13 VITALS — BP 149/109 | HR 91 | Ht 72.5 in | Wt 261.0 lb

## 2020-10-13 DIAGNOSIS — R072 Precordial pain: Secondary | ICD-10-CM | POA: Diagnosis not present

## 2020-10-13 DIAGNOSIS — F17200 Nicotine dependence, unspecified, uncomplicated: Secondary | ICD-10-CM

## 2020-10-13 DIAGNOSIS — I10 Essential (primary) hypertension: Secondary | ICD-10-CM

## 2020-10-13 DIAGNOSIS — R55 Syncope and collapse: Secondary | ICD-10-CM | POA: Diagnosis not present

## 2020-10-13 NOTE — Patient Instructions (Addendum)
Medication Instructions:   Your physician recommends that you continue on your current medications as directed. Please refer to the Current Medication list given to you today.  *If you need a refill on your cardiac medications before your next appointment, please call your pharmacy*   Lab Work: None ordered    Testing/Procedures:  1.  Your physician has requested that you have an echocardiogram. Echocardiography is a painless test that uses sound waves to create images of your heart. It provides your doctor with information about the size and shape of your heart and how well your heart's chambers and valves are working. This procedure takes approximately one hour. There are no restrictions for this procedure.  2.  [facility] MYOVIEW      Your caregiver has ordered a Stress Test with nuclear imaging. The purpose of this test is to evaluate the blood supply to your heart muscle. This procedure is referred to as a "Non-Invasive Stress Test." This is because other than having an IV started in your vein, nothing is inserted or "invades" your body. Cardiac stress tests are done to find areas of poor blood flow to the heart by determining the extent of coronary artery disease (CAD). Some patients exercise on a treadmill, which naturally increases the blood flow to your heart, while others who are  unable to walk on a treadmill due to physical limitations have a pharmacologic/chemical stress agent called Lexiscan . This medicine will mimic walking on a treadmill by temporarily increasing your coronary blood flow.      PLEASE REPORT TO [facility] MEDICAL MALL ENTRANCE   THE VOLUNTEERS AT THE FIRST DESK WILL DIRECT YOU WHERE TO GO    *Please note: these test may take anywhere between 2-4 hours to complete      Date of Procedure:_____________________________________   Arrival Time for Procedure:______________________________    PLEASE NOTIFY THE OFFICE AT LEAST 24 HOURS IN ADVANCE IF YOU ARE UNABLE TO KEEP  YOUR APPOINTMENT.  [phone]  PLEASE NOTIFY NUCLEAR MEDICINE AT [facility] AT LEAST 24 HOURS IN ADVANCE IF YOU ARE UNABLE TO KEEP YOUR APPOINTMENT. [phone]       How to prepare for your Myoview test:    _XX___:  Hold other medications as follows: valsartan-hydrochlorothiazide    1. Do not eat or drink after midnight  2. No caffeine for 24 hours prior to test  3. No smoking 24 hours prior to test.  4. Unless instructed otherwise, Take your medication with a small sips of water.       Follow-Up: At [facility], you and your health needs are our priority.  As part of our continuing mission to provide you with exceptional heart care, we have created designated Provider Care Teams.  These Care Teams include your primary Cardiologist (physician) and Advanced Practice Providers (APPs -  Physician Assistants and Nurse Practitioners) who all work together to provide you with the care you need, when you need it.  We recommend signing up for the patient portal called "MyChart".  Sign up information is provided on this After Visit Summary.  MyChart is used to connect with patients for Virtual Visits (Telemedicine).  Patients are able to view lab/test results, encounter notes, upcoming appointments, etc.  Non-urgent messages can be sent to your provider as well.   To learn more about what you can do with MyChart, go to [email or web address].    Your next appointment:   Follow up after testing   The format for your next appointment:  In Person  Provider:   Kate Sable, MD   Other Instructions

## 2020-10-13 NOTE — Progress Notes (Signed)
Cardiology Office Note:    Date:  10/13/2020   ID:  Isaiah Brown, DOB 1961-01-29, MRN [id number]  PCP:  Loura Pardon, MD   [facility]  Cardiologist:  Debbe Odea, MD  Advanced Practice Provider:  No care team member to display Electrophysiologist:  None       Referring MD: Loura Pardon, MD   Chief Complaint  Patient presents with  . New Patient (Initial Visit)    [facility] follow up - Syncope. Patient c.o SOB "I smoke a lot" and Chest tightness. Meds reviewed verbally with patient.     History of Present Illness:    Isaiah Brown is a 60 y.o. male with a hx of anxiety, hypertension, current smoker x40+ years who presents due to chest tightness and near syncope.  States having chest pain which she rates as per 3 out of 10 in severity.  Symptoms have been ongoing for couple of months, not related with exertion.  He states not seeing a doctor for some time now.  Recently established care with primary care provider where blood pressure was noted to be elevated.  Was started on valsartan/HCTZ, which he has taken for the past 4 days, with improvement in blood pressures.  Systolics were usually in the 190s, currently in the 140s.  Had an episode of syncope 2 months ago while shopping at a grocery store.  He was pushing the shopping cart when he suddenly felt lightheaded, dizzy, felt like his legs gave out on him.  He was taken by EMS to the hospital where work-up was unrevealing.  He attributes symptoms to anxiety/stress from recently losing his job.  He is currently on disability due to back problems.  Past Medical History:  Diagnosis Date  . Anxiety   . Hypertension     Past Surgical History:  Procedure Laterality Date  . APPENDECTOMY    . CHOLECYSTECTOMY    . HERNIA REPAIR    . MOUTH SURGERY Left    Cyst on gum  . SKIN CANCER EXCISION Left 2018   [facility]    Current Medications: Current Meds  Medication Sig  . acetaminophen (TYLENOL) 500 MG tablet  Take 2,000 mg by mouth every 6 (six) hours as needed.  . valsartan-hydrochlorothiazide (DIOVAN-HCT) 80-12.5 MG tablet Take 1 tablet by mouth daily.     Allergies:   Other   Social History   Socioeconomic History  . Marital status: Single    Spouse name: Not on file  . Number of children: Not on file  . Years of education: Not on file  . Highest education level: Not on file  Occupational History  . Not on file  Tobacco Use  . Smoking status: Current Every Day Smoker    Years: 46.00    Types: Cigars  . Smokeless tobacco: Never Used  Vaping Use  . Vaping Use: Former  Substance and Sexual Activity  . 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 Drug use: Never  . Sexual activity: Yes  Other Topics Concern  . Not on file  Social History Narrative  . Not on file   Social Determinants of Health   Financial Resource Strain: Not on file  Food Insecurity: Not on file  Transportation Needs: Not on file  Physical Activity: Not on file  Stress: Not on file  Social Connections: Not on file     Family History: The patient's He was adopted. Family history is unknown  by patient.  ROS:   Please see the history of present illness.     All other systems reviewed and are negative.  EKGs/Labs/Other Studies Reviewed:    The following studies were reviewed today:  EKG:  EKG is  ordered today.  The ekg ordered today demonstrates normal sinus rhythm,  Recent Labs: 10/01/2020: ALT 11; BUN 14; Creatinine, Ser 1.10; Hemoglobin 19.7; Platelets 208; Potassium 3.9; Sodium 142 10/02/2020: TSH 15.800  Recent Lipid Panel    Component Value Date/Time   CHOL 235 (H) 10/01/2020 0936   TRIG 149 10/01/2020 0936   HDL 41 10/01/2020 0936   CHOLHDL 5.7 (H) 10/01/2020 0936   LDLCALC 167 (H) 10/01/2020 0936     Risk Assessment/Calculations:      Physical Exam:    VS:  BP (!) 149/109 (BP Location: Right Arm, Patient Position: Sitting, Cuff Size: Large)   Pulse 91    Ht 6' 0.5" (1.842 m)   Wt 261 lb (118.4 kg)   SpO2 98%   BMI 34.91 kg/m     Wt Readings from Last 3 Encounters:  10/13/20 261 lb (118.4 kg)  10/10/20 263 lb 8 oz (119.5 kg)  10/02/20 263 lb (119.3 kg)     GEN:  Well nourished, well developed in no acute distress HEENT: Normal NECK: No JVD; No carotid bruits LYMPHATICS: No lymphadenopathy CARDIAC: RRR, no murmurs, rubs, gallops RESPIRATORY: Diminished breath sounds at bases, otherwise clear ABDOMEN: Soft, non-tender, non-distended MUSCULOSKELETAL:  No edema; No deformity  SKIN: Warm and dry NEUROLOGIC:  Alert and oriented x 3 PSYCHIATRIC:  Normal affect   ASSESSMENT:    1. Precordial pain   2. Primary hypertension   3. Smoking   4. Syncope and collapse    PLAN:    In order of problems listed above:  1. Patient with chest pain, risk factors of hypertension, current smoker.  Get echocardiogram to evaluate systolic and diastolic function.  Patient has chronic back pain, not able to exercise.  Will need a pharmacologic stress study.  Obtain Lexiscan Myoview to evaluate ischemia. 2. Hypertension, BP elevated, improving.  Continue losartan-HCTZ 80-12.5 mg twice daily.  If at follow-up visit, BP still elevated, will plan to titrate meds. 3. Current smoker, cessation advised. 4. History of syncope, appears vasovagal in etiology.  No further episodes.  Echo as above to evaluate any structural abnormalities.  Follow-up after echocardiogram and Myoview   Shared Decision Making/Informed Consent The risks [chest pain, shortness of breath, cardiac arrhythmias, dizziness, blood pressure fluctuations, myocardial infarction, stroke/transient ischemic attack, nausea, vomiting, allergic reaction, radiation exposure, metallic taste sensation and life-threatening complications (estimated to be 1 in 10,000)], benefits (risk stratification, diagnosing coronary artery disease, treatment guidance) and alternatives of a nuclear stress test were  discussed in detail with Isaiah Brown and he agrees to proceed.   Medication Adjustments/Labs and Tests Ordered: Current medicines are reviewed at length with the patient today.  Concerns regarding medicines are outlined above.  Orders Placed This Encounter  Procedures  . NM Myocar Multi W/Spect W/Wall Motion / EF  . EKG 12-Lead  . ECHOCARDIOGRAM COMPLETE   No orders of the defined types were placed in this encounter.   Patient Instructions  Medication Instructions:   Your physician recommends that you continue on your current medications as directed. Please refer to the Current Medication list given to you today.  *If you need a refill on your cardiac medications before your next appointment, please call your pharmacy*   Lab Work: None  ordered    Testing/Procedures:  1.  Your physician has requested that you have an echocardiogram. Echocardiography is a painless test that uses sound waves to create images of your heart. It provides your doctor with information about the size and shape of your heart and how well your heart's chambers and valves are working. This procedure takes approximately one hour. There are no restrictions for this procedure.  2.  [facility] MYOVIEW      Your caregiver has ordered a Stress Test with nuclear imaging. The purpose of this test is to evaluate the blood supply to your heart muscle. This procedure is referred to as a "Non-Invasive Stress Test." This is because other than having an IV started in your vein, nothing is inserted or "invades" your body. Cardiac stress tests are done to find areas of poor blood flow to the heart by determining the extent of coronary artery disease (CAD). Some patients exercise on a treadmill, which naturally increases the blood flow to your heart, while others who are  unable to walk on a treadmill due to physical limitations have a pharmacologic/chemical stress agent called Lexiscan . This medicine will mimic walking on a  treadmill by temporarily increasing your coronary blood flow.      PLEASE REPORT TO [facility] MEDICAL MALL ENTRANCE   THE VOLUNTEERS AT THE FIRST DESK WILL DIRECT YOU WHERE TO GO    *Please note: these test may take anywhere between 2-4 hours to complete      Date of Procedure:_____________________________________   Arrival Time for Procedure:______________________________    PLEASE NOTIFY THE OFFICE AT LEAST 24 HOURS IN ADVANCE IF YOU ARE UNABLE TO KEEP YOUR APPOINTMENT.  [phone]  PLEASE NOTIFY NUCLEAR MEDICINE AT [facility] AT LEAST 24 HOURS IN ADVANCE IF YOU ARE UNABLE TO KEEP YOUR APPOINTMENT. [phone]       How to prepare for your Myoview test:    _XX___:  Hold other medications as follows: valsartan-hydrochlorothiazide    1. Do not eat or drink after midnight  2. No caffeine for 24 hours prior to test  3. No smoking 24 hours prior to test.  4. Unless instructed otherwise, Take your medication with a small sips of water.       Follow-Up: At [facility], you and your health needs are our priority.  As part of our continuing mission to provide you with exceptional heart care, we have created designated Provider Care Teams.  These Care Teams include your primary Cardiologist (physician) and Advanced Practice Providers (APPs -  Physician Assistants and Nurse Practitioners) who all work together to provide you with the care you need, when you need it.  We recommend signing up for the patient portal called "MyChart".  Sign up information is provided on this After Visit Summary.  MyChart is used to connect with patients for Virtual Visits (Telemedicine).  Patients are able to view lab/test results, encounter notes, upcoming appointments, etc.  Non-urgent messages can be sent to your provider as well.   To learn more about what you can do with MyChart, go to [email or web address].    Your next appointment:   Follow up after testing   The format for your next appointment:    In Person  Provider:   Debbe Odea, MD   Other Instructions      Signed, Debbe Odea, MD  10/13/2020 5:19 PM    [facility]

## 2020-10-14 NOTE — Progress Notes (Signed)
10/15/2020  1:32 PM   Isaiah Brown Jan 27, 1961 [id number]  Referring provider: Charlynne Cousins, MD [address] [place],  [place] [address] Chief Complaint  Patient presents with  . Hematuria    HPI: Isaiah Brown is a 60 y.o. male who presents today for further evaluation of microscopic hematuria.  He underwent routine urinalysis by his PCP on 10/01/2020.  This revealed incidental 3-10 red blood cells per high-powered field and otherwise unremarkable urine.  He is never been told about blood in his urine previously.  No episodes of gross hematuria.  He denies kidney stones, flank pain, or kidney pain. Patient reports long history of using alcohol, so this might have caused strain on his kidneys.   Patient said that he noticed that he does not have a steady stream like how he used to. He reported that he has been experiencing complete bladder emptying.  Still smokes little cigars (for the last 10 years), but tried the nicotine patch and had an allergic reaction to that, so he stopped using them.   UA from today shows: RBC: 11-30 Calcium oxide crystals but is otherwise negative   PSA 10/01/2020: 0.5    PMH: Past Medical History:  Diagnosis Date  . Anxiety   . Hypertension     Surgical History: Past Surgical History:  Procedure Laterality Date  . APPENDECTOMY    . CHOLECYSTECTOMY    . HERNIA REPAIR    . MOUTH SURGERY Left    Cyst on gum  . SKIN CANCER EXCISION Left 2018   [facility] Medications:  Allergies as of 10/15/2020      Reactions   Other Rash   Nicotine Patch      Medication List       Accurate as of October 15, 2020  1:32 PM. If you have any questions, ask your nurse or doctor.        acetaminophen 500 MG tablet Commonly known as: TYLENOL Take 2,000 mg by mouth every 6 (six) hours as needed.   valsartan-hydrochlorothiazide 80-12.5 MG tablet Commonly known as: DIOVAN-HCT Take 1 tablet by mouth daily.       Allergies: Allergies  Allergen  Reactions  . Other Rash    Nicotine Patch    Family History: Family History  Adopted: Yes  Family history unknown: Yes    Social History:   reports that he has been smoking cigars. He has smoked for the past 46.00 years. He has never used smokeless tobacco. He reports previous alcohol use. He reports that he does not use drugs.  ROS: Pertinent ROS in HPI.  Physical Exam: BP (!) 162/117   Pulse 86   Ht 6' (1.829 m)   Wt 261 lb (118.4 kg)   BMI 35.40 kg/m   Constitutional:  Alert and oriented, No acute distress. HEENT: [place] AT, moist mucus membranes.  Trachea midline, no masses. Cardiovascular: No clubbing, cyanosis, or edema. Respiratory: Normal respiratory effort, no increased work of breathing. Skin: No rashes, bruises or suspicious lesions. Neurologic: Grossly intact, no focal deficits, moving all 4 extremities. Psychiatric: Normal mood and affect.  Laboratory Data:  Microscopic Examination from 10/01/2020:  Component Ref Range & Units 2 wk ago  (10/01/20) 2 wk ago  (10/01/20) 1 mo ago  (08/26/20) 1 mo ago  (08/26/20)  WBC, UA 0 - 5 /hpf None seen   0-5 R    RBC 0 - 2 /hpf 3-10Abnormal  6.21High R   5.48 R   Epithelial  Cells (non renal) 0 - 10 /hpf 0-10      Mucus, UA Not Estab. PresentAbnormal      Bacteria, UA None seen/Few None seen   NONE SEEN R     Lipid panel from 10/01/2020:  Cholesterol, Total 100 - 199 mg/dL 235High   Triglycerides 0 - 149 mg/dL 149   HDL >39 mg/dL 41   VLDL Cholesterol Cal 5 - 40 mg/dL 27   LDL Chol Calc (NIH) 0 - 99 mg/dL 167High   Chol/HDL Ratio 0.0 - 5.0 ratio 5.7High    CBC with differential/platelet from 10/01/2020:  Component Ref Range & Units 2 wk ago 1 mo ago  WBC 3.4 - 10.8 x10E3/uL 11.3High  14.4High R   RBC 4.14 - 5.80 x10E6/uL 6.21High  5.48 R   Hemoglobin 13.0 - 17.7 g/dL 19.7High  17.4High R   Hematocrit 37.5 - 51.0 % 55.2High  49.8 R   MCV 79 - 97 fL 89  90.9 R   MCH 26.6 - 33.0 pg 31.7  31.8 R    MCHC 31.5 - 35.7 g/dL 35.7  34.9 R   RDW 11.6 - 15.4 % 13.3  13.5 R   Platelets 150 - 450 x10E3/uL 208  173 R   Neutrophils Not Estab. % 78  82 R   Lymphs Not Estab. % 12    Monocytes Not Estab. % 9    Eos Not Estab. % 1    Basos Not Estab. % 0    Neutrophils Absolute 1.4 - 7.0 x10E3/uL 8.8High  11.7High R   Lymphocytes Absolute 0.7 - 3.1 x10E3/uL 1.3  1.2 R   Monocytes Absolute 0.1 - 0.9 x10E3/uL 1.0High    EOS (ABSOLUTE) 0.0 - 0.4 x10E3/uL 0.1    Basophils Absolute 0.0 - 0.2 x10E3/uL 0.1  0.1 R   Immature Granulocytes Not Estab. % 0  1 R   Immature Grans (Abs) 0.0 - 0.1 x10E3/uL 0.0    nRBC   0.0 R   Lymphocytes Relative   8 R   Monocytes Relative   8 R   Monocytes Absolute   1.2High R   Eosinophils Relative   1 R   Eosinophils Absolute   0.1 R   Basophils Relative   0 R   Abs Immature Granulocytes   0.09High   CMP from 10/01/2020:  Component Ref Range & Units 2 wk ago 1 mo ago  Glucose 65 - 99 mg/dL 89  114High R, CM   BUN 8 - 27 mg/dL 14  19 R   Creatinine, Ser 0.76 - 1.27 mg/dL 1.10  1.06 R   eGFR >59 mL/min/1.73 76    BUN/Creatinine Ratio 10 - 24 13    Sodium 134 - 144 mmol/L 142  139 R   Potassium 3.5 - 5.2 mmol/L 3.9  3.2Low R   Chloride 96 - 106 mmol/L 104  108 R   CO2 20 - 29 mmol/L 16Low  23 R   Calcium 8.6 - 10.2 mg/dL 9.6  8.7Low R   Total Protein 6.0 - 8.5 g/dL 7.6  6.6 R   Albumin 3.8 - 4.8 g/dL 4.8  3.7 R   Globulin, Total 1.5 - 4.5 g/dL 2.8    Albumin/Globulin Ratio 1.2 - 2.2 1.7    Bilirubin Total 0.0 - 1.2 mg/dL 0.7  1.1 R   Alkaline Phosphatase 44 - 121 IU/L 73  41 R   AST 0 - 40 IU/L 22  22 R   ALT 0 -  44 IU/L 11  14 R     Lab Results  Component Value Date   HGBA1C 5.0 10/01/2020   I have reviewed the labs.  Urinalysis  10/01/2020:  Component Ref Range & Units 2 wk ago 1 mo ago  Specific Gravity, UA 1.005 - 1.030 >1.030High    pH, UA 5.0 - 7.5 6.0    Color, UA Yellow Yellow    Appearance Ur Clear Clear     Leukocytes,UA Negative Negative    Protein,UA Negative/Trace 2+Abnormal    Glucose, UA Negative Negative  NEGATIVE R   Ketones, UA Negative TraceAbnormal    RBC, UA Negative 3+Abnormal    Bilirubin, UA Negative Negative    Urobilinogen, Ur 0.2 - 1.0 mg/dL 1.0    Nitrite, UA Negative Negative    Microscopic Examination  See below:    Color, Urine   YELLOWAbnormal R   Leukocytes,Ua   NEGATIVE R   RBC / HPF   11-20 R   WBC, UA   0-5 R   Bacteria, UA   NONE SEEN R   Squamous Epithelial / LPF   NONE SEEN R   Mucus   PRESENT CM   APPearance   CLEARAbnormal R   Specific Gravity, Urine   1.009 R   pH   7.0 R   Hgb urine dipstick   MODERATEAbnormal R   Bilirubin Urine   NEGATIVE R   Ketones, ur   5Abnormal R   Protein, ur   NEGATIVE R   Nitrite   NEGATIVE R    I have reviewed the labs.   Urine Culture:  10/01/2020:  Component 2 wk ago  Urine Culture, Routine Final report   Organism ID, Bacteria Comment   Comment: Culture shows less than 10,000 colony forming units of bacteria per  milliliter of urine. This colony count is not generally considered  to be clinically significant.     I have reviewed the labs.  Pertinent Imaging: No recent upper tract imaging, renal ultrasound was ordered and scheduled for 11/04/2020 by his primary care physician     Assessment & Plan:    1. Microscopic hematuria High risk, smoker  Will get a CTU scan to look at kidneys, ureter tubes, and bladder-renal ultrasound is not sufficient for upper tract imaging for this patient given that he feels into the high risk category both from his age as well as history of smoking  Will get cystoscopy to screen for bladder cancer  Based on [employer] practice guidelines asymptomatic microhematuria [facility]) is defined as three or greater red blood cells per high powered field on a properly collected urinary specimen in the absence of an obvious benign cause. A positive dipstick  does not define AMH, and evaluation should be based solely on findings from microscopic examination of urinary sediment and not on a dipstick reading.   2. Benign prostatic hyperplasia (BPH) with straining on urination Worsening obstructive urinary symptoms  Patient agreed to try Flomax, but wanted to consult with his PCP first, to see if it was okay-has been working towards getting his blood pressure under control and has had some recent changes in his BP meds  We will evaluate prostatic/bladder anatomy with cystoscopy as above  Follow Up: CT urogram/cystoscopy  I, Ardyth Gal, am acting as a scribe for Dr. Hollice Espy.   I have reviewed the above documentation for accuracy and completeness, and I agree with the above.   Hollice Espy, MD  [facility] [address], [place], [place] [address] [phone]

## 2020-10-15 ENCOUNTER — Ambulatory Visit (INDEPENDENT_AMBULATORY_CARE_PROVIDER_SITE_OTHER): Payer: 59 | Admitting: Urology

## 2020-10-15 ENCOUNTER — Other Ambulatory Visit: Payer: Self-pay | Admitting: Nurse Practitioner

## 2020-10-15 ENCOUNTER — Other Ambulatory Visit: Payer: Self-pay

## 2020-10-15 ENCOUNTER — Encounter: Payer: Self-pay | Admitting: Urology

## 2020-10-15 ENCOUNTER — Telehealth: Payer: Self-pay

## 2020-10-15 VITALS — BP 162/117 | HR 86 | Ht 72.0 in | Wt 261.0 lb

## 2020-10-15 DIAGNOSIS — R310 Gross hematuria: Secondary | ICD-10-CM | POA: Diagnosis not present

## 2020-10-15 DIAGNOSIS — N401 Enlarged prostate with lower urinary tract symptoms: Secondary | ICD-10-CM | POA: Diagnosis not present

## 2020-10-15 DIAGNOSIS — R3916 Straining to void: Secondary | ICD-10-CM | POA: Diagnosis not present

## 2020-10-15 NOTE — Patient Instructions (Signed)
Cystoscopy Cystoscopy is a procedure that is used to help diagnose and sometimes treat conditions that affect the lower urinary tract. The lower urinary tract includes the bladder and the urethra. The urethra is the tube that drains urine from the bladder. Cystoscopy is done using a thin, tube-shaped instrument with a light and camera at the end (cystoscope). The cystoscope may be hard or flexible, depending on the goal of the procedure. The cystoscope is inserted through the urethra, into the bladder. Cystoscopy may be recommended if you have:  Urinary tract infections that keep coming back.  Blood in the urine (hematuria).  An inability to control when you urinate (urinary incontinence) or an overactive bladder.  Unusual cells found in a urine sample.  A blockage in the urethra, such as a urinary stone.  Painful urination.  An abnormality in the bladder found during an intravenous pyelogram (IVP) or CT scan. Cystoscopy may also be done to remove a sample of tissue to be examined under a microscope (biopsy). What are the risks? Generally, this is a safe procedure. However, problems may occur, including:  Infection.  Bleeding.  What happens during the procedure?  1. You will be given one or more of the following: ? A medicine to numb the area (local anesthetic). 2. The area around the opening of your urethra will be cleaned. 3. The cystoscope will be passed through your urethra into your bladder. 4. Germ-free (sterile) fluid will flow through the cystoscope to fill your bladder. The fluid will stretch your bladder so that your health care provider can clearly examine your bladder walls. 5. Your doctor will look at the urethra and bladder. 6. The cystoscope will be removed The procedure may vary among health care providers  What can I expect after the procedure? After the procedure, it is common to have: 1. Some soreness or pain in your abdomen and urethra. 2. Urinary symptoms.  These include: ? Mild pain or burning when you urinate. Pain should stop within a few minutes after you urinate. This may last for up to 1 week. ? A small amount of blood in your urine for several days. ? Feeling like you need to urinate but producing only a small amount of urine. Follow these instructions at home: General instructions  Return to your normal activities as told by your health care provider.   Do not drive for 24 hours if you were given a sedative during your procedure.  Watch for any blood in your urine. If the amount of blood in your urine increases, call your health care provider.  If a tissue sample was removed for testing (biopsy) during your procedure, it is up to you to get your test results. Ask your health care provider, or the department that is doing the test, when your results will be ready.  Drink enough fluid to keep your urine pale yellow.  Keep all follow-up visits as told by your health care provider. This is important. Contact a health care provider if you:  Have pain that gets worse or does not get better with medicine, especially pain when you urinate.  Have trouble urinating.  Have more blood in your urine. Get help right away if you:  Have blood clots in your urine.  Have abdominal pain.  Have a fever or chills.  Are unable to urinate. Summary  Cystoscopy is a procedure that is used to help diagnose and sometimes treat conditions that affect the lower urinary tract.  Cystoscopy is done using   a thin, tube-shaped instrument with a light and camera at the end.  After the procedure, it is common to have some soreness or pain in your abdomen and urethra.  Watch for any blood in your urine. If the amount of blood in your urine increases, call your health care provider.  If you were prescribed an antibiotic medicine, take it as told by your health care provider. Do not stop taking the antibiotic even if you start to feel better. This  information is not intended to replace advice given to you by your health care provider. Make sure you discuss any questions you have with your health care provider. Document Revised: 06/13/2018 Document Reviewed: 06/13/2018 Elsevier Patient Education  2020 [employer]   

## 2020-10-15 NOTE — Telephone Encounter (Signed)
Spoke with patient about the color of bilateral leg since starting his new blood pressure medication (Valsartan-Hctz), He states that his legs are more red than normal, no swelling, and no discomfort. The area around where he was using a Nicoderm patch is red and flaky. Patient was offered virtual appointment tomorrow but patient states that he wishes to wait until his in office appointment on Monday.  Patient informed that if any further changes take place that he should go immediately to the ER.  Patient is taking an OTC allergy med to help with reaction from patch.

## 2020-10-16 ENCOUNTER — Other Ambulatory Visit: Payer: Self-pay | Admitting: Nurse Practitioner

## 2020-10-16 LAB — MICROSCOPIC EXAMINATION: Bacteria, UA: NONE SEEN

## 2020-10-16 LAB — URINALYSIS, COMPLETE
Bilirubin, UA: NEGATIVE
Glucose, UA: NEGATIVE
Ketones, UA: NEGATIVE
Leukocytes,UA: NEGATIVE
Nitrite, UA: NEGATIVE
Specific Gravity, UA: 1.02 (ref 1.005–1.030)
Urobilinogen, Ur: 0.2 mg/dL (ref 0.2–1.0)
pH, UA: 6 (ref 5.0–7.5)

## 2020-10-20 ENCOUNTER — Ambulatory Visit (INDEPENDENT_AMBULATORY_CARE_PROVIDER_SITE_OTHER): Payer: 59 | Admitting: Internal Medicine

## 2020-10-20 ENCOUNTER — Encounter: Payer: Self-pay | Admitting: Internal Medicine

## 2020-10-20 ENCOUNTER — Other Ambulatory Visit: Payer: Self-pay

## 2020-10-20 VITALS — BP 136/92 | HR 84 | Temp 98.3°F | Ht 72.44 in | Wt 261.6 lb

## 2020-10-20 DIAGNOSIS — F17200 Nicotine dependence, unspecified, uncomplicated: Secondary | ICD-10-CM

## 2020-10-20 DIAGNOSIS — Z122 Encounter for screening for malignant neoplasm of respiratory organs: Secondary | ICD-10-CM

## 2020-10-20 MED ORDER — ALBUTEROL SULFATE (2.5 MG/3ML) 0.083% IN NEBU: 2.5000 mg | INHALATION_SOLUTION | Freq: Four times a day (QID) | RESPIRATORY_TRACT | 12 refills | Status: AC | PRN

## 2020-10-20 NOTE — Progress Notes (Signed)
BP (!) 136/92   Pulse 84   Temp 98.3 F (36.8 C) (Oral)   Ht 6' 0.44" (1.84 m)   Wt 261 lb 9.6 oz (118.7 kg)   SpO2 96%   BMI 35.05 kg/m    Subjective:    Patient ID: Isaiah Brown, male    DOB: October 02, 1960, 60 y.o.   MRN: [id number]  HPI: Rishit Burkhalter is a 60 y.o. male  Had a rash sec to nicoderm patches, was also on welbutrin stopped taking this  Hypertension Associated symptoms include chest pain. Pertinent negatives include no palpitations. Identifiable causes of hypertension include a thyroid problem.  Thyroid Problem Presents for follow-up visit. Symptoms include cold intolerance, dry skin, fatigue and hair loss. Patient reports no constipation, depressed mood, diaphoresis, diarrhea, heat intolerance, hoarse voice, leg swelling, nail problem, palpitations, tremors, visual change, weight gain or weight loss. (Anxious  )  Chest Pain  This is a chronic problem. Pertinent negatives include no diaphoresis or palpitations.  His past medical history is significant for hypertension and thyroid problem.    Chief Complaint  Patient presents with  . Hypertension    Patient states that he has seen Urology and Cardiology.     Relevant past medical, surgical, family and social history reviewed and updated as indicated. Interim medical history since our last visit reviewed. Allergies and medications reviewed and updated.  Review of Systems  Constitutional: Positive for fatigue. Negative for diaphoresis, weight gain and weight loss.  HENT: Negative for hoarse voice.   Cardiovascular: Positive for chest pain. Negative for palpitations.  Gastrointestinal: Negative for constipation and diarrhea.  Endocrine: Positive for cold intolerance. Negative for heat intolerance.  Neurological: Negative for tremors.    Per HPI unless specifically indicated above     Objective:    BP (!) 136/92   Pulse 84   Temp 98.3 F (36.8 C) (Oral)   Ht 6' 0.44" (1.84 m)   Wt 261 lb 9.6 oz  (118.7 kg)   SpO2 96%   BMI 35.05 kg/m   Wt Readings from Last 3 Encounters:  10/20/20 261 lb 9.6 oz (118.7 kg)  10/15/20 261 lb (118.4 kg)  10/13/20 261 lb (118.4 kg)    Physical Exam Vitals and nursing note reviewed.  Constitutional:      General: He is not in acute distress.    Appearance: Normal appearance. He is not ill-appearing or diaphoretic.  HENT:     Head: Normocephalic and atraumatic.     Right Ear: Tympanic membrane and external ear normal. There is no impacted cerumen.     Left Ear: External ear normal.     Nose: No congestion or rhinorrhea.     Mouth/Throat:     Pharynx: No oropharyngeal exudate or posterior oropharyngeal erythema.  Eyes:     Conjunctiva/sclera: Conjunctivae normal.     Pupils: Pupils are equal, round, and reactive to light.  Cardiovascular:     Rate and Rhythm: Normal rate and regular rhythm.     Heart sounds: No murmur heard. No friction rub. No gallop.   Pulmonary:     Effort: No respiratory distress.     Breath sounds: No stridor. No wheezing or rhonchi.  Chest:     Chest wall: No tenderness.  Abdominal:     General: Abdomen is flat. Bowel sounds are normal.     Palpations: Abdomen is soft. There is no mass.     Tenderness: There is no abdominal tenderness.  Musculoskeletal:  Cervical back: Normal range of motion and neck supple. No rigidity or tenderness.     Left lower leg: No edema.  Skin:    General: Skin is warm and dry.  Neurological:     Mental Status: He is alert.     Results for orders placed or performed in visit on 10/15/20  Microscopic Examination   Urine  Result Value Ref Range   WBC, UA 0-5 0 - 5 /hpf   RBC 11-30 (A) 0 - 2 /hpf   Epithelial Cells (non renal) 0-10 0 - 10 /hpf   Casts Present (A) None seen /lpf   Cast Type Hyaline casts N/A   Crystals Present (A) N/A   Crystal Type Calcium Oxalate N/A   Bacteria, UA None seen None seen/Few  Urinalysis, Complete  Result Value Ref Range   Specific Gravity,  UA 1.020 1.005 - 1.030   pH, UA 6.0 5.0 - 7.5   Color, UA Yellow Yellow   Appearance Ur Cloudy (A) Clear   Leukocytes,UA Negative Negative   Protein,UA 1+ (A) Negative/Trace   Glucose, UA Negative Negative   Ketones, UA Negative Negative   RBC, UA 2+ (A) Negative   Bilirubin, UA Negative Negative   Urobilinogen, Ur 0.2 0.2 - 1.0 mg/dL   Nitrite, UA Negative Negative   Microscopic Examination See below:         Current Outpatient Medications:  .  acetaminophen (TYLENOL) 500 MG tablet, Take 2,000 mg by mouth every 6 (six) hours as needed., Disp: , Rfl:  .  valsartan-hydrochlorothiazide (DIOVAN-HCT) 80-12.5 MG tablet, Take 1 tablet by mouth daily., Disp: 30 tablet, Rfl: 0    Assessment & Plan:  1. Syncope  ? etio  seen cardiologist to have a stress trst on the 27th  Stop methocarbamol  hasnt used tramadol since rx   2. Nicotine abuse SE from nicoderm patches. Cut back to 2 ppd from 3 ppd.  Smoking cessation advised.. continues to smoke. more than > 5 - 10 mins of time was spent with pt regarding smoking cessation and complications.  Consider Lung cancer screening with Low dose CT chest.  Wants to try nicotine lozenge  3. Back pain chronic stable, will need to fu with ortho for such  Continue NSAIDS as rx by ortho  Stop / hold methocarbamol as dose recently increased ?cause of syncope.   4. ETOH abuse in the past LFts wnl from Er Will check   5. Leukocytosis not addressed in ER ? Etiology  Recheck  Consider CXR  Check UA   6. Hb 17 ? Sec to chronic smoking h.o ? MDS will recheck consdier heme onc referal if continues to go higher.   7. Elevated TSH : subclinical hypothyroidism To have US thyroid  FT4  FT3  ? Thyroiditis      Ref. Range 10/01/2020 09:36 10/01/2020 09:55 10/02/2020 11:25  TSH Latest Ref Range: 0.450 - 4.500 uIU/mL 22.700 (H)  15.800 (H)  Triiodothyronine,Free,Serum Latest Ref Range: 2.0 - 4.4 pg/mL   2.7  T4,Free(Direct) Latest Ref Range: 0.82  - 1.77 ng/dL   0.76    Sees urology for ? Prostate cancer screening  MRI   Problem List Items Addressed This Visit   None      Follow up plan: No follow-ups on file.

## 2020-10-20 NOTE — Patient Instructions (Signed)
Hypothyroidism  Hypothyroidism is when the thyroid gland does not make enough of certain hormones (it is underactive). The thyroid gland is a small gland located in the lower front part of the neck, just in front of the windpipe (trachea). This gland makes hormones that help control how the body uses food for energy (metabolism) as well as how the heart and brain function. These hormones also play a role in keeping your bones strong. When the thyroid is underactive, it produces too little of the hormones thyroxine (T4) and triiodothyronine (T3). What are the causes? This condition may be caused by:  Hashimoto's disease. This is a disease in which the body's disease-fighting system (immune system) attacks the thyroid gland. This is the most common cause.  Viral infections.  Pregnancy.  Certain medicines.  Birth defects.  Past radiation treatments to the head or neck for cancer.  Past treatment with radioactive iodine.  Past exposure to radiation in the environment.  Past surgical removal of part or all of the thyroid.  Problems with a gland in the center of the brain (pituitary gland).  Lack of enough iodine in the diet. What increases the risk? You are more likely to develop this condition if:  You are male.  You have a family history of thyroid conditions.  You use a medicine called lithium.  You take medicines that affect the immune system (immunosuppressants). What are the signs or symptoms? Symptoms of this condition include:  Feeling as though you have no energy (lethargy).  Not being able to tolerate cold.  Weight gain that is not explained by a change in diet or exercise habits.  Lack of appetite.  Dry skin.  Coarse hair.  Menstrual irregularity.  Slowing of thought processes.  Constipation.  Sadness or depression. How is this diagnosed? This condition may be diagnosed based on:  Your symptoms, your medical history, and a physical exam.  Blood  tests. You may also have imaging tests, such as an ultrasound or MRI. How is this treated? This condition is treated with medicine that replaces the thyroid hormones that your body does not make. After you begin treatment, it may take several weeks for symptoms to go away. Follow these instructions at home:  Take over-the-counter and prescription medicines only as told by your health care provider.  If you start taking any new medicines, tell your health care provider.  Keep all follow-up visits as told by your health care provider. This is important. ? As your condition improves, your dosage of thyroid hormone medicine may change. ? You will need to have blood tests regularly so that your health care provider can monitor your condition. Contact a health care provider if:  Your symptoms do not get better with treatment.  You are taking thyroid hormone replacement medicine and you: ? Sweat a lot. ? Have tremors. ? Feel anxious. ? Lose weight rapidly. ? Cannot tolerate heat. ? Have emotional swings. ? Have diarrhea. ? Feel weak. Get help right away if you have:  Chest pain.  An irregular heartbeat.  A rapid heartbeat.  Difficulty breathing. Summary  Hypothyroidism is when the thyroid gland does not make enough of certain hormones (it is underactive).  When the thyroid is underactive, it produces too little of the hormones thyroxine (T4) and triiodothyronine (T3).  The most common cause is Hashimoto's disease, a disease in which the body's disease-fighting system (immune system) attacks the thyroid gland. The condition can also be caused by viral infections, medicine, pregnancy, or   past radiation treatment to the head or neck.  Symptoms may include weight gain, dry skin, constipation, feeling as though you do not have energy, and not being able to tolerate cold.  This condition is treated with medicine to replace the thyroid hormones that your body does not make. This  information is not intended to replace advice given to you by your health care provider. Make sure you discuss any questions you have with your health care provider. Document Revised: 03/21/2020 Document Reviewed: 03/06/2020 Elsevier Patient Education  2021 [employer] Inc.  

## 2020-10-23 ENCOUNTER — Telehealth: Payer: Self-pay | Admitting: *Deleted

## 2020-10-23 ENCOUNTER — Encounter: Payer: Self-pay | Admitting: *Deleted

## 2020-10-23 DIAGNOSIS — F17200 Nicotine dependence, unspecified, uncomplicated: Secondary | ICD-10-CM

## 2020-10-23 DIAGNOSIS — Z122 Encounter for screening for malignant neoplasm of respiratory organs: Secondary | ICD-10-CM

## 2020-10-23 DIAGNOSIS — Z87891 Personal history of nicotine dependence: Secondary | ICD-10-CM

## 2020-10-23 NOTE — Telephone Encounter (Signed)
Received referral for initial lung cancer screening scan. Contacted patient and obtained smoking history,(current smoker, 1 ppd x 46 yrs) as well as answering questions related to screening process. Patient denies signs of lung cancer such as weight loss or hemoptysis. Patient denies comorbidity that would prevent curative treatment if lung cancer were found. Patient is scheduled for shared decision making visit and CT scan on 11/07/20 @ 1:45 pm.

## 2020-10-24 ENCOUNTER — Ambulatory Visit: Payer: Medicaid Other | Admitting: Cardiology

## 2020-10-29 ENCOUNTER — Ambulatory Visit: Payer: 59

## 2020-10-30 ENCOUNTER — Telehealth: Payer: Self-pay | Admitting: Cardiology

## 2020-10-30 NOTE — Telephone Encounter (Signed)
Fax received from [facility] to request medical records. Forwarded to [facility]

## 2020-11-04 ENCOUNTER — Ambulatory Visit: Payer: 59

## 2020-11-04 ENCOUNTER — Ambulatory Visit
Admission: RE | Admit: 2020-11-04 | Discharge: 2020-11-04 | Disposition: A | Discharge status: Home / Self Care | Payer: 59 | Source: Ambulatory Visit | Attending: Internal Medicine | Admitting: Internal Medicine

## 2020-11-04 ENCOUNTER — Other Ambulatory Visit: Payer: Self-pay

## 2020-11-04 DIAGNOSIS — R7989 Other specified abnormal findings of blood chemistry: Secondary | ICD-10-CM | POA: Diagnosis present

## 2020-11-06 ENCOUNTER — Other Ambulatory Visit: Payer: Self-pay | Admitting: Nurse Practitioner

## 2020-11-06 ENCOUNTER — Ambulatory Visit (INDEPENDENT_AMBULATORY_CARE_PROVIDER_SITE_OTHER): Payer: 59 | Admitting: Internal Medicine

## 2020-11-06 ENCOUNTER — Other Ambulatory Visit: Payer: Self-pay

## 2020-11-06 ENCOUNTER — Encounter: Payer: Self-pay | Admitting: Internal Medicine

## 2020-11-06 VITALS — BP 125/86 | HR 85 | Temp 98.3°F | Ht 72.44 in | Wt 263.6 lb

## 2020-11-06 DIAGNOSIS — I10 Essential (primary) hypertension: Secondary | ICD-10-CM | POA: Diagnosis not present

## 2020-11-06 MED ORDER — LEVOTHYROXINE SODIUM 25 MCG PO TABS: 25.0000 ug | ORAL_TABLET | Freq: Every day | ORAL | 3 refills | Status: DC

## 2020-11-06 NOTE — Progress Notes (Signed)
BP 125/86   Pulse 85   Temp 98.3 F (36.8 C) (Oral)   Ht 6' 0.44" (1.84 m)   Wt 263 lb 9.6 oz (119.6 kg)   SpO2 98%   BMI 35.32 kg/m    Subjective:    Patient ID: Isaiah Brown, male    DOB: [birth year], 60 y.o.   MRN: [id number]  HPI: Isaiah Brown is a 60 y.o. male  Hypertension This is a chronic problem. Identifiable causes of hypertension include a thyroid problem.  Thyroid Problem Presents for follow-up (recent US showed  Findings are nonspecific though could be seen) visit. Patient reports no hoarse voice, menstrual problem or nail problem.    Chief Complaint  Patient presents with  . Hypertension    Relevant past medical, surgical, family and social history reviewed and updated as indicated. Interim medical history since our last visit reviewed. Allergies and medications reviewed and updated.  Review of Systems  HENT: Negative for hoarse voice.   Genitourinary: Negative for menstrual problem.    Per HPI unless specifically indicated above     Objective:    BP 125/86   Pulse 85   Temp 98.3 F (36.8 C) (Oral)   Ht 6' 0.44" (1.84 m)   Wt 263 lb 9.6 oz (119.6 kg)   SpO2 98%   BMI 35.32 kg/m   Wt Readings from Last 3 Encounters:  11/06/20 263 lb 9.6 oz (119.6 kg)  10/20/20 261 lb 9.6 oz (118.7 kg)  10/15/20 261 lb (118.4 kg)    Physical Exam  Results for orders placed or performed in visit on 10/15/20  Microscopic Examination   Urine  Result Value Ref Range   WBC, UA 0-5 0 - 5 /hpf   RBC 11-30 (A) 0 - 2 /hpf   Epithelial Cells (non renal) 0-10 0 - 10 /hpf   Casts Present (A) None seen /lpf   Cast Type Hyaline casts N/A   Crystals Present (A) N/A   Crystal Type Calcium Oxalate N/A   Bacteria, UA None seen None seen/Few  Urinalysis, Complete  Result Value Ref Range   Specific Gravity, UA 1.020 1.005 - 1.030   pH, UA 6.0 5.0 - 7.5   Color, UA Yellow Yellow   Appearance Ur Cloudy (A) Clear   Leukocytes,UA Negative Negative   Protein,UA 1+  (A) Negative/Trace   Glucose, UA Negative Negative   Ketones, UA Negative Negative   RBC, UA 2+ (A) Negative   Bilirubin, UA Negative Negative   Urobilinogen, Ur 0.2 0.2 - 1.0 mg/dL   Nitrite, UA Negative Negative   Microscopic Examination See below:         Current Outpatient Medications:  .  acetaminophen (TYLENOL) 500 MG tablet, Take 2,000 mg by mouth every 6 (six) hours as needed., Disp: , Rfl:  .  albuterol (PROVENTIL) (2.5 MG/3ML) 0.083% nebulizer solution, Take 3 mLs (2.5 mg total) by nebulization every 6 (six) hours as needed for wheezing or shortness of breath., Disp: 75 mL, Rfl: 12 .  valsartan-hydrochlorothiazide (DIOVAN-HCT) 80-12.5 MG tablet, Take 1 tablet by mouth daily., Disp: 30 tablet, Rfl: 0    US thyroid :   Markedly heterogeneous and slightly atrophic thyroid without discrete nodule mass. Findings are nonspecific though could be seen in the setting of a chronic thyroiditis. Clinical correlation is advised.  Assessment & Plan:  1. Thyroiditis :  Will start pt on levothyroxine 25 mcg for such  HYPOTHYROIDISM PLEASE TAKE YOUR THYROID MEDICATION FIRST THING IN THE MORNING  WHILST FASTING.  NO MEDICATION/ FOOD FOR AN HOUR AFTER INGESTING THYROID PILLS.  2. nuclear stress test was ordered but per pt copay is too high for him needs to dw cards if needs other stress test.  3. HTn stable. HTN :  Continue current meds.  Medication compliance emphasised. pt advised to keep Bp logs. Pt verbalised understanding of the same. Pt to have a low salt diet . Exercise to reach a goal of at least 150 mins a week.  lifestyle modifications explained and pt understands importance of the above.  4. Seen urology to have a CT? MRI  Says he has a bunch of bills to pay and copays are high.  5. Smoking cessation cut back to 11/2 ppd  . Nicotine abuse SE from nicoderm patches. Cut back to 2 ppd from 3 ppd.  Smoking cessation advised.. continues to smoke. more than > 5 - 10 mins of time  was spent with pt regarding smoking cessation and complications. To have Lung cancer screening with Low dose CT chest.  Wants to try nicotine lozenge otc   6. Back pain chronic stable, will need to fu with ortho for such  Continue NSAIDS as rx by ortho  Stop / hold methocarbamol as dose recently increased ?cause of syncope.    7. Hb 17 ? Sec to chronic smoking h.o ? MDS will recheck consdier heme onc referal if continues to go higher.         Problem List Items Addressed This Visit   None      Follow up plan: No follow-ups on file.

## 2020-11-07 ENCOUNTER — Ambulatory Visit: Payer: 59

## 2020-11-07 ENCOUNTER — Inpatient Hospital Stay: Payer: 59 | Admitting: Nurse Practitioner

## 2020-11-10 ENCOUNTER — Ambulatory Visit: Payer: 59 | Admitting: Cardiology

## 2020-11-11 ENCOUNTER — Other Ambulatory Visit: Payer: 59

## 2020-11-12 ENCOUNTER — Other Ambulatory Visit: Payer: Self-pay | Admitting: Urology

## 2020-11-14 ENCOUNTER — Ambulatory Visit
Admission: RE | Admit: 2020-11-14 | Discharge: 2020-11-14 | Disposition: A | Discharge status: Home / Self Care | Payer: 59 | Source: Ambulatory Visit | Attending: Oncology | Admitting: Oncology

## 2020-11-14 ENCOUNTER — Inpatient Hospital Stay: Payer: 59 | Attending: Hospice and Palliative Medicine | Admitting: Hospice and Palliative Medicine

## 2020-11-14 ENCOUNTER — Other Ambulatory Visit: Payer: Self-pay

## 2020-11-14 DIAGNOSIS — F17200 Nicotine dependence, unspecified, uncomplicated: Secondary | ICD-10-CM | POA: Insufficient documentation

## 2020-11-14 DIAGNOSIS — Z87891 Personal history of nicotine dependence: Secondary | ICD-10-CM | POA: Diagnosis present

## 2020-11-14 DIAGNOSIS — Z122 Encounter for screening for malignant neoplasm of respiratory organs: Secondary | ICD-10-CM

## 2020-11-14 DIAGNOSIS — F17290 Nicotine dependence, other tobacco product, uncomplicated: Secondary | ICD-10-CM

## 2020-11-14 NOTE — Progress Notes (Signed)
Virtual Visit via Telephone Note  I connected with@ on 11/14/20 at@ by telephone and verified that I am speaking with the correct person using two identifiers.   I discussed the limitations of evaluation and management by telemedicine and the availability of in person appointments. The patient expressed understanding and agreed to proceed.  Location: Patient: [facility] Provider: Clinic   In accordance with CMS guidelines, patient has met eligibility criteria including age, absence of signs or symptoms of lung cancer.  Social History   Tobacco Use  . Smoking status: Current Every Day Smoker    Packs/day: 1.00    Years: 46.00    Pack years: 46.00    Types: Cigars  . Smokeless tobacco: Never Used  Vaping Use  . Vaping Use: Former  Substance Use Topics  . 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 Drug use: Never      A shared decision-making session was conducted prior to the performance of CT scan. This includes one or more decision aids, includes benefits and harms of screening, follow-up diagnostic testing, over-diagnosis, false positive rate, and total radiation exposure.   Counseling on the importance of adherence to annual lung cancer LDCT screening, impact of co-morbidities, and ability or willingness to undergo diagnosis and treatment is imperative for compliance of the program.   Counseling on the importance of continued smoking cessation for former smokers; the importance of smoking cessation for current smokers, and information about tobacco cessation interventions have been given to patient including [place] and 1800 quit [place] programs.   Written order for lung cancer screening with LDCT has been given to the patient and any and all questions have been answered to the best of my abilities.    Yearly follow up will be coordinated by Burgess Estelle, Thoracic Navigator.  Time Total: 10 minutes  Visit consisted of counseling and education  dealing with complex health screening. Greater than 50%  of this time was spent counseling and coordinating care related to the above assessment and plan.  Signed by: Altha Harm, PhD, NP-C

## 2020-11-18 ENCOUNTER — Ambulatory Visit
Admission: RE | Admit: 2020-11-18 | Discharge: 2020-11-18 | Disposition: A | Discharge status: Home / Self Care | Payer: 59 | Source: Ambulatory Visit | Attending: Urology | Admitting: Urology

## 2020-11-18 ENCOUNTER — Other Ambulatory Visit: Payer: Self-pay

## 2020-11-18 DIAGNOSIS — R310 Gross hematuria: Secondary | ICD-10-CM | POA: Insufficient documentation

## 2020-11-18 LAB — POCT I-STAT CREATININE: Creatinine, Ser: 1.2 mg/dL (ref 0.61–1.24)

## 2020-11-18 MED ORDER — IOHEXOL 300 MG/ML  SOLN
125.0000 mL | Freq: Once | INTRAMUSCULAR | Status: AC | PRN
  Administered 2020-11-18: 125 mL via INTRAVENOUS

## 2020-11-20 ENCOUNTER — Ambulatory Visit: Payer: 59 | Admitting: Cardiology

## 2020-11-25 ENCOUNTER — Encounter: Payer: Self-pay | Admitting: Urology

## 2020-11-25 ENCOUNTER — Encounter: Payer: Self-pay | Admitting: *Deleted

## 2020-11-25 ENCOUNTER — Ambulatory Visit (INDEPENDENT_AMBULATORY_CARE_PROVIDER_SITE_OTHER): Payer: 59 | Admitting: Urology

## 2020-11-25 ENCOUNTER — Other Ambulatory Visit: Payer: Self-pay

## 2020-11-25 VITALS — BP 129/82 | HR 90 | Ht 72.0 in | Wt 261.0 lb

## 2020-11-25 DIAGNOSIS — N35912 Unspecified bulbous urethral stricture, male: Secondary | ICD-10-CM

## 2020-11-25 DIAGNOSIS — N200 Calculus of kidney: Secondary | ICD-10-CM

## 2020-11-25 DIAGNOSIS — R3129 Other microscopic hematuria: Secondary | ICD-10-CM

## 2020-11-25 NOTE — Progress Notes (Signed)
   11/25/20  CC:  Chief Complaint  Patient presents with  . Cysto    HPI: 60 year old male with hematuria who presents today for cystoscopy.  Since last visit, he underwent CT urogram on 11/19/2020 which showed 3 mm right lower pole stone as well as a simple right renal cyst measuring 2.3 cm.  There are some distortion of the right lateral bladder base.  Blood pressure 129/82, pulse 90, height 6' (1.829 m), weight 261 lb (118.4 kg). NED. A&Ox3.   No respiratory distress   Abd soft, NT, ND Normal phallus with bilateral descended testicles  Cystoscopy Procedure Note  Patient identification was confirmed, informed consent was obtained, and patient was prepped using Betadine solution.  Lidocaine jelly was administered per urethral meatus.     Pre-Procedure: - Inspection reveals a normal caliber ureteral meatus.  Procedure: The flexible cystoscope was introduced without difficulty -Within the bulbar urethra, there was an approximately 5 [place] nondense relatively short appearing bulbar urethral stricture however this was too small to accommodate the flexible cystoscope.  As such, the procedure was aborted.   Post-Procedure: - Patient tolerated the procedure well  Assessment/ Plan:  1. Microscopic hematuria Unable to completely evaluate bladder secondary to urethral stricture which in fact may be the underlying cause of microscopic blood  Recommend cystoscopy the time of urethral dilation as outlined below with intervention as deemed necessary - Urinalysis, Complete - CULTURE, URINE COMPREHENSIVE  2. 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  We discussed in office dilation versus proceeding to the operating room for dilation and/or DVIU versus urethroplasty.  He is most interested in proceeding to the operating room with some sedation in order to dilate the urethra with a balloon (Optilume).  We discussed the  need for Foley catheter for 24 hours post procedure.  We discussed the risk including recurrence of the stricture, damage surrounding structures, discomfort, bleeding, infection amongst others.  All questions answered.  We will proceed with cystoscopy at the same time to rule out any underlying bladder pathology.  If bladder pathology is identified, this could also be addressed intraoperatively.  He is agreeable this plan.  3. Kidney stones Incidental asymptomatic 3 mm stone, recommend observation   Vanna Scotland, MD

## 2020-11-25 NOTE — Patient Instructions (Signed)
Urethral Dilation  Urethral dilation is a procedure to stretch open (dilate) the urethra. The urethra is the tube that drains urine from the bladder out of the body. In women, the urethra opens above the vaginal opening. In men, the urethra opens at the tip of the penis. Urethral dilation is usually done to treat narrowing of the urethra (urethral stricture), which can make it difficult to pass urine. Urethral dilation widens the urethra so that you can pass urine normally. Urethral dilation is done through the urethral opening. There are no incisions made during the procedure. Tell a health care provider about:  Any allergies you have.  All medicines you are taking, including vitamins, herbs, eye drops, creams, and over-the-counter medicines.  Any problems you or family members have had with anesthetic medicines.  Any blood disorders you have.  Any surgeries you have had.  Any medical conditions you have.  Whether you are pregnant or may be pregnant. What are the risks? Generally, this is a safe procedure. However, problems may occur, including:  Bleeding.  Infection.  A return of urethral stricture, which requires repeating the dilation procedure.  Damage to the urethra, which may require reconstructive surgery.  Allergic reactions to medicines. What happens before the procedure? Medicines Ask your health care provider about:  Changing or stopping your regular medicines. This is especially important if you are taking diabetes medicines or blood thinners.  Taking medicines such as aspirin and ibuprofen. These medicines can thin your blood. Do not take these medicines unless your health care provider tells you to take them.  Taking over-the-counter medicines, vitamins, herbs, and supplements. General instructions  Follow instructions from your health care provider about eating or drinking restrictions.  Plan to have someone take you home from the hospital or clinic.  If  you will be going home right after the procedure, plan to have someone with you for 24 hours.  Ask your health care provider what steps will be taken to help prevent infection. These may include: ? Washing skin with a germ-killing soap. ? Taking antibiotic medicine. What happens during the procedure?  An IV may be inserted into one of your veins.  You will be given one or more of the following medicines: ? A local anesthetic to numb your urethral opening. This will be applied as a gel that will also lubricate the urethral opening. ? A sedative to help you relax.  A thin tube with a light and camera on the end (cystoscope) will be inserted into your urethra.  Your urethra will be rinsed (irrigated) with a germ-free (sterile) water solution.  Narrow parts of your urethra will be stretched open using a dilator tool. Your surgeon will start with a very thin dilator, then use wider dilators as needed.  A thin tube with an inflatable balloon on the tip may be inserted into your urethra. The balloon may be inflated to help stretch your urethra open.  Your urethra will be irrigated. The procedure may vary among health care providers and hospitals. What can I expect after the procedure?  After the procedure, it is common to have: ? Burning pain when urinating. ? Blood in your urine. ? A need to urinate frequently.  You will be asked to urinate before you leave the hospital or clinic.  Your urine flow should improve within a few days. Follow these instructions at home: Medicines  Take over-the-counter and prescription medicines only as told by your health care provider.  If you were prescribed  an antibiotic medicine, take it as told by your health care provider. Do not stop taking the antibiotic even if you start to feel better.  Ask your health care provider if the medicine prescribed to you: ? Requires you to avoid driving or using heavy machinery. ? Can cause constipation. You may  need to take these actions to prevent or treat constipation:  Take over-the-counter or prescription medicines.  Eat foods that are high in fiber, such as beans, whole grains, and fresh fruits and vegetables.  Limit foods that are high in fat and processed sugars, such as fried or sweet foods. General instructions  Do not drive for 24 hours if you were given a sedative during your procedure.  If you were sent home with a small, lubricated tube (catheter) to help keep your urethra open, follow your health care provider's instructions about how and when to use it.  Drink enough fluid to keep your urine pale yellow.  Return to your normal activities as told by your health care provider. Ask your health care provider what activities are safe for you.  Keep all follow-up visits as told by your health care provider. This is important. Contact a health care provider if:  Your urine is cloudy and smells bad.  You develop new bleeding when you urinate.  You pass blood clots when you urinate.  You have pain that does not get better with medicine.  You have a fever.  You have swelling, bruising, or discoloration of your genital area. This includes the penis, scrotum, and inner thighs for men, and the outer genital organs (vulva) and inner thighs for women. Get help right away if:  You develop new bleeding that does not stop.  You cannot pass urine. Summary  Urethral dilation is a procedure to stretch open (dilate) the urethra.  Urethral dilation is usually done to treat narrowing of the urethra (urethral stricture), which can make it difficult to pass urine.  Ask your health care provider about changing or stopping your regular medicines before the procedure.  After the procedure, it is common to have burning pain when urinating, blood in your urine, and a need to urinate frequently. This information is not intended to replace advice given to you by your health care provider. Make  sure you discuss any questions you have with your health care provider. Document Revised: 08/03/2018 Document Reviewed: 08/03/2018 Elsevier Patient Education  2021 [employer].

## 2020-11-26 ENCOUNTER — Other Ambulatory Visit: Payer: Self-pay | Admitting: Urology

## 2020-11-26 DIAGNOSIS — R3129 Other microscopic hematuria: Secondary | ICD-10-CM

## 2020-11-26 DIAGNOSIS — N35912 Unspecified bulbous urethral stricture, male: Secondary | ICD-10-CM

## 2020-11-27 LAB — URINALYSIS, COMPLETE
Bilirubin, UA: NEGATIVE
Glucose, UA: NEGATIVE
Ketones, UA: NEGATIVE
Leukocytes,UA: NEGATIVE
Nitrite, UA: NEGATIVE
Protein,UA: NEGATIVE
Specific Gravity, UA: 1.025 (ref 1.005–1.030)
Urobilinogen, Ur: 0.2 mg/dL (ref 0.2–1.0)
pH, UA: 6.5 (ref 5.0–7.5)

## 2020-11-27 LAB — MICROSCOPIC EXAMINATION: Bacteria, UA: NONE SEEN

## 2020-11-28 LAB — CULTURE, URINE COMPREHENSIVE

## 2020-12-18 ENCOUNTER — Ambulatory Visit (INDEPENDENT_AMBULATORY_CARE_PROVIDER_SITE_OTHER): Payer: 59 | Admitting: Internal Medicine

## 2020-12-18 ENCOUNTER — Encounter: Payer: Self-pay | Admitting: Internal Medicine

## 2020-12-18 ENCOUNTER — Other Ambulatory Visit: Payer: Self-pay

## 2020-12-18 VITALS — BP 138/82 | HR 76 | Temp 98.4°F | Ht 72.44 in | Wt 262.2 lb

## 2020-12-18 DIAGNOSIS — E039 Hypothyroidism, unspecified: Secondary | ICD-10-CM

## 2020-12-18 DIAGNOSIS — Z131 Encounter for screening for diabetes mellitus: Secondary | ICD-10-CM

## 2020-12-18 DIAGNOSIS — R55 Syncope and collapse: Secondary | ICD-10-CM

## 2020-12-18 DIAGNOSIS — E785 Hyperlipidemia, unspecified: Secondary | ICD-10-CM

## 2020-12-18 DIAGNOSIS — Z125 Encounter for screening for malignant neoplasm of prostate: Secondary | ICD-10-CM

## 2020-12-18 DIAGNOSIS — I10 Essential (primary) hypertension: Secondary | ICD-10-CM | POA: Diagnosis not present

## 2020-12-18 MED ORDER — SPIRIVA RESPIMAT 2.5 MCG/ACT IN AERS: 2.0000 | INHALATION_SPRAY | Freq: Every day | RESPIRATORY_TRACT | 6 refills | Status: DC

## 2020-12-18 MED ORDER — ALBUTEROL SULFATE HFA 108 (90 BASE) MCG/ACT IN AERS: 2.0000 | INHALATION_SPRAY | Freq: Four times a day (QID) | RESPIRATORY_TRACT | 0 refills | Status: DC | PRN

## 2020-12-18 MED ORDER — VALSARTAN-HYDROCHLOROTHIAZIDE 80-12.5 MG PO TABS: 1.0000 | ORAL_TABLET | Freq: Every day | ORAL | 2 refills | Status: DC

## 2020-12-18 MED ORDER — VALSARTAN-HYDROCHLOROTHIAZIDE 80-12.5 MG PO TABS: 1.0000 | ORAL_TABLET | Freq: Every day | ORAL | 0 refills | Status: DC

## 2020-12-18 NOTE — Progress Notes (Signed)
BP 138/82   Pulse 76   Temp 98.4 F (36.9 C) (Oral)   Ht 6' 0.44" (1.84 m)   Wt 262 lb 3.2 oz (118.9 kg)   SpO2 96%   BMI 35.13 kg/m    Subjective:    Patient ID: Isaiah Brown, male    DOB: Dec 28, 1960, 60 y.o.   MRN: [id number]  Chief Complaint  Patient presents with   Hypertension   Hyperthyroidism    Wants to discuss medication states he did no see a change    HPI: Isaiah Brown is a 60 y.o. male  Pt is here for a follow up has a ho syncope ? Sec to ms relaxants and htn, htn, hematuria, hyothyroidism  Hypertension This is a chronic problem. The current episode started more than 1 year ago. The problem is controlled. Pertinent negatives include no anxiety, blurred vision, malaise/fatigue, neck pain or peripheral edema. Identifiable causes of hypertension include a thyroid problem.  Thyroid Problem Presents for follow-up visit. Patient reports no cold intolerance, hoarse voice or leg swelling. The symptoms have been improving.   Chief Complaint  Patient presents with   Hypertension   Hyperthyroidism    Wants to discuss medication states he did no see a change    Relevant past medical, surgical, family and social history reviewed and updated as indicated. Interim medical history since our last visit reviewed. Allergies and medications reviewed and updated.  Review of Systems  Constitutional:  Negative for malaise/fatigue.  HENT:  Negative for hoarse voice.   Eyes:  Negative for blurred vision.  Endocrine: Negative for cold intolerance.  Musculoskeletal:  Negative for neck pain.   Per HPI unless specifically indicated above     Objective:    BP 138/82   Pulse 76   Temp 98.4 F (36.9 C) (Oral)   Ht 6' 0.44" (1.84 m)   Wt 262 lb 3.2 oz (118.9 kg)   SpO2 96%   BMI 35.13 kg/m   Wt Readings from Last 3 Encounters:  12/18/20 262 lb 3.2 oz (118.9 kg)  11/25/20 261 lb (118.4 kg)  11/14/20 261 lb (118.4 kg)    Physical Exam Vitals and nursing note  reviewed.  Constitutional:      General: He is not in acute distress.    Appearance: Normal appearance. He is not ill-appearing or diaphoretic.  HENT:     Head: Normocephalic and atraumatic.     Right Ear: Tympanic membrane and external ear normal. There is no impacted cerumen.     Left Ear: External ear normal.     Nose: No congestion or rhinorrhea.     Mouth/Throat:     Pharynx: No oropharyngeal exudate or posterior oropharyngeal erythema.  Eyes:     Conjunctiva/sclera: Conjunctivae normal.     Pupils: Pupils are equal, round, and reactive to light.  Cardiovascular:     Rate and Rhythm: Normal rate and regular rhythm.     Heart sounds: No murmur heard.   No friction rub. No gallop.  Pulmonary:     Effort: No respiratory distress.     Breath sounds: No stridor. No wheezing or rhonchi.  Chest:     Chest wall: No tenderness.  Abdominal:     General: Abdomen is flat. Bowel sounds are normal.     Palpations: Abdomen is soft. There is no mass.     Tenderness: There is no abdominal tenderness.  Musculoskeletal:     Cervical back: Normal range of motion and neck supple. No  rigidity or tenderness.     Left lower leg: No edema.  Skin:    General: Skin is warm and dry.  Neurological:     Mental Status: He is alert.   Results for orders placed or performed in visit on 11/25/20  CULTURE, URINE COMPREHENSIVE   Specimen: Urine   UR  Result Value Ref Range   Urine Culture, Comprehensive Final report    Organism ID, Bacteria Comment   Microscopic Examination   Urine  Result Value Ref Range   WBC, UA 0-5 0 - 5 /hpf   RBC 11-30 (A) 0 - 2 /hpf   Epithelial Cells (non renal) 0-10 0 - 10 /hpf   Casts Present (A) None seen /lpf   Cast Type Granular casts (A) N/A   Bacteria, UA None seen None seen/Few  Urinalysis, Complete  Result Value Ref Range   Specific Gravity, UA 1.025 1.005 - 1.030   pH, UA 6.5 5.0 - 7.5   Color, UA Orange Yellow   Appearance Ur Hazy (A) Clear    Leukocytes,UA Negative Negative   Protein,UA Negative Negative/Trace   Glucose, UA Negative Negative   Ketones, UA Negative Negative   RBC, UA 2+ (A) Negative   Bilirubin, UA Negative Negative   Urobilinogen, Ur 0.2 0.2 - 1.0 mg/dL   Nitrite, UA Negative Negative   Microscopic Examination See below:         Current Outpatient Medications:    acetaminophen (TYLENOL) 500 MG tablet, Take 2,000 mg by mouth every 6 (six) hours as needed., Disp: , Rfl:    albuterol (PROVENTIL) (2.5 MG/3ML) 0.083% nebulizer solution, Take 3 mLs (2.5 mg total) by nebulization every 6 (six) hours as needed for wheezing or shortness of breath., Disp: 75 mL, Rfl: 12   levothyroxine (SYNTHROID) 25 MCG tablet, Take 1 tablet (25 mcg total) by mouth daily., Disp: 30 tablet, Rfl: 3   valsartan-hydrochlorothiazide (DIOVAN-HCT) 80-12.5 MG tablet, Take 1 tablet by mouth daily., Disp: 30 tablet, Rfl: 0    Assessment & Plan:  Microscopic hematuria : to screen for bladder cancer sec to smoking ho Kidney stones Incidental asymptomatic 3 mm stone, recommend observation Urethral stricture noted per urology  Appreciate input.  HTN :  Continue current meds.  Medication compliance emphasised. pt advised to keep Bp logs. Pt verbalised understanding of the same. Pt to have a low salt diet . Exercise to reach a goal of at least 150 mins a week.  lifestyle modifications explained and pt understands importance of the above.  Hypothyroidism is on 25 mcg of synthroid  PLEASE TAKE YOUR THYROID MEDICATION FIRST THING IN THE MORNING WHILST FASTING.  NO MEDICATION/ FOOD FOR AN HOUR AFTER INGESTING THYROID PILLS.  Needs to fu with endocrine.   COPD: Start pt on albuterol and spiriva  Emphysema on CT chest   Syncope didn't get nuclear stress test given the cost.   HLD: recheck LDL   Ref. Range 10/01/2020 09:36  Total CHOL/HDL Ratio Latest Ref Range: 0.0 - 5.0 ratio 5.7 (H)  Cholesterol, Total Latest Ref Range: 100 - 199 mg/dL 403  (H)  HDL Cholesterol Latest Ref Range: >39 mg/dL 41  Triglycerides Latest Ref Range: 0 - 149 mg/dL 474  VLDL Cholesterol Cal Latest Ref Range: 5 - 40 mg/dL 27  LDL Chol Calc (NIH) Latest Ref Range: 0 - 99 mg/dL 259 (H)   Problem List Items Addressed This Visit   None    No orders of the defined types were placed  in this encounter.    Meds ordered this encounter  Medications   valsartan-hydrochlorothiazide (DIOVAN-HCT) 80-12.5 MG tablet    Sig: Take 1 tablet by mouth daily.    Dispense:  30 tablet    Refill:  0     Follow up plan: No follow-ups on file.   Obtained results of TSH which is higher will increase dose of synthroid to 50 mcg daily.  Will need to fu with endocrinology as .  Results for NYEEM, STOKE (MRN [id number]) as of 12/19/2020 09:35  Ref. Range 10/02/2020 11:25 12/18/2020 11:41  TSH Latest Ref Range: 0.450 - 4.500 uIU/mL 15.800 (H) 24.800 (H)  Triiodothyronine,Free,Serum Latest Ref Range: 2.0 - 4.4 pg/mL 2.7   T4,Free(Direct) Latest Ref Range: 0.82 - 1.77 ng/dL 9.15 0.56

## 2020-12-19 ENCOUNTER — Other Ambulatory Visit: Payer: 59

## 2020-12-19 ENCOUNTER — Telehealth: Payer: Self-pay

## 2020-12-19 ENCOUNTER — Encounter: Payer: Self-pay | Admitting: Internal Medicine

## 2020-12-19 LAB — COMPREHENSIVE METABOLIC PANEL
ALT: 18 IU/L (ref 0–44)
AST: 23 IU/L (ref 0–40)
Albumin/Globulin Ratio: 1.7 (ref 1.2–2.2)
Albumin: 4.5 g/dL (ref 3.8–4.8)
Alkaline Phosphatase: 70 IU/L (ref 44–121)
BUN/Creatinine Ratio: 20 (ref 10–24)
BUN: 22 mg/dL (ref 8–27)
Bilirubin Total: 0.4 mg/dL (ref 0.0–1.2)
CO2: 22 mmol/L (ref 20–29)
Calcium: 9.9 mg/dL (ref 8.6–10.2)
Chloride: 106 mmol/L (ref 96–106)
Creatinine, Ser: 1.08 mg/dL (ref 0.76–1.27)
Globulin, Total: 2.7 g/dL (ref 1.5–4.5)
Glucose: 74 mg/dL (ref 65–99)
Potassium: 3.6 mmol/L (ref 3.5–5.2)
Sodium: 144 mmol/L (ref 134–144)
Total Protein: 7.2 g/dL (ref 6.0–8.5)
eGFR: 78 mL/min/{1.73_m2} (ref >59)

## 2020-12-19 LAB — LIPID PANEL
Chol/HDL Ratio: 6 ratio — ABNORMAL HIGH (ref 0.0–5.0)
Cholesterol, Total: 227 mg/dL — ABNORMAL HIGH (ref 100–199)
HDL: 38 mg/dL — ABNORMAL LOW (ref >39)
LDL Chol Calc (NIH): 159 mg/dL — ABNORMAL HIGH (ref 0–99)
Triglycerides: 166 mg/dL — ABNORMAL HIGH (ref 0–149)
VLDL Cholesterol Cal: 30 mg/dL (ref 5–40)

## 2020-12-19 LAB — TSH: TSH: 24.8 u[IU]/mL — ABNORMAL HIGH (ref 0.450–4.500)

## 2020-12-19 LAB — T4, FREE: Free T4: 1.04 ng/dL (ref 0.82–1.77)

## 2020-12-19 MED ORDER — LEVOTHYROXINE SODIUM 50 MCG PO TABS: 50.0000 ug | ORAL_TABLET | Freq: Every day | ORAL | 3 refills | Status: DC

## 2020-12-19 NOTE — Telephone Encounter (Signed)
Patient called and informed that his TSH levels are high, He has not been taking his Thyroid meds for the past 3 to 4 days.

## 2020-12-23 NOTE — Progress Notes (Signed)
Please let pt know this was high , needs to fu with endocrinology thnx

## 2020-12-24 ENCOUNTER — Telehealth: Payer: Self-pay

## 2020-12-24 NOTE — Telephone Encounter (Signed)
Copied from CRM [id number]. Topic: General - Inquiry >> Dec 24, 2020  3:52 PM Aretta Nip wrote: Reason for CRM: levothyroxine (SYNTHROID) 50 MCG tablet 30 tablet 3 12/19/2020   Sig - Route: Take 1 tablet (50 mcg total) by mouth daily. - Oral Please touch base with pt to advise, he seems to remember in conversation at appt that he was to take twice a day. Just wanting to make sure, he could have misunderstood but will be taking 1 a day per instructions? pls advise [phone]

## 2020-12-25 NOTE — Telephone Encounter (Signed)
Patient verbalized understanding  

## 2020-12-25 NOTE — Telephone Encounter (Signed)
Returned Patients call, informed him that he is to take 2 tablets of Levothyroxine first thing in the morning, and not 1 in morning and one later on in the day.

## 2020-12-26 ENCOUNTER — Telehealth: Payer: Self-pay | Admitting: Urology

## 2020-12-26 NOTE — Telephone Encounter (Signed)
PT. CALLED TO CANCEL HIS POST OP APPOINTMENT FOR FOLEY REMOVAL. PT. STATES HE SPOKE TO DR. BRANDON ABOUT HIS SISTER WHO IS IN THE MEDICAL FIELD  ASSISTING HIM WITH REMOVAL AT HOME AFTER SURGERY. PATIENT DOES HAVE A FEW QUESTIONS FOR CLINICAL STAFF REGARDING THE FOLEY REMOVAL PLEASE CALL PT PRIOR TO SURGERY ON 01/12/21 TO ADVISE

## 2020-12-26 NOTE — Telephone Encounter (Signed)
Spoke with patient-reviewed foley removal instructions in detail. Voiced understanding.

## 2020-12-30 ENCOUNTER — Encounter: Payer: 59 | Admitting: Urology

## 2021-01-02 ENCOUNTER — Other Ambulatory Visit: Payer: Self-pay

## 2021-01-02 ENCOUNTER — Encounter
Admission: RE | Admit: 2021-01-02 | Discharge: 2021-01-02 | Disposition: A | Discharge status: Home / Self Care | Payer: 59 | Source: Ambulatory Visit | Attending: Urology | Admitting: Urology

## 2021-01-02 HISTORY — DX: Sleep apnea, unspecified: G47.30

## 2021-01-02 HISTORY — DX: Chronic obstructive pulmonary disease, unspecified: J44.9

## 2021-01-02 HISTORY — DX: Other chronic pain: G89.29

## 2021-01-02 NOTE — Patient Instructions (Addendum)
Your procedure is scheduled on: 01/12/21 Report to DAY SURGERY DEPARTMENT LOCATED ON 2ND FLOOR MEDICAL MALL ENTRANCE. To find out your arrival time please call [phone] between 1PM - 3PM on 01/09/21.  Remember: Instructions that are not followed completely may result in serious medical risk, up to and including death, or upon the discretion of your surgeon and anesthesiologist your surgery may need to be rescheduled.     _X__ 1. Do not eat food or drink any liquids after midnight the night before your procedure.                 No gum chewing or hard candies.   __X__2.  On the morning of surgery brush your teeth with toothpaste and water, you                 may rinse your mouth with mouthwash if you wish.  Do not swallow any              toothpaste of mouthwash.     _X__ 3.  No Alcohol for 24 hours before or after surgery.   _X__ 4.  Do Not Smoke or use e-cigarettes For 24 Hours Prior to Your Surgery.                 Do not use any chewable tobacco products for at least 6 hours prior to                 surgery.  ____  5.  Bring all medications with you on the day of surgery if instructed.   __X__  6.  Notify your doctor if there is any change in your medical condition      (cold, fever, infections).     Do not wear jewelry, make-up, hairpins, clips or nail polish. Do not wear lotions, powders, or perfumes.  Do not shave 48 hours prior to surgery. Men may shave face and neck. Do not bring valuables to the hospital.    [facility] is not responsible for any belongings or valuables.  Contacts, dentures/partials or body piercings may not be worn into surgery. Bring a case for your contacts, glasses or hearing aids, a denture cup will be supplied. Leave your suitcase in the car. After surgery it may be brought to your room. For patients admitted to the hospital, discharge time is determined by your treatment team.   Patients discharged the day of surgery will not be allowed to drive  home.   Please read over the following fact sheets that you were given:     __X__ Take these medicines the morning of surgery with A SIP OF WATER:    1. levothyroxine (SYNTHROID) 50 MCG tablet  2.   3.   4.  5.  6.  ____ Fleet Enema (as directed)   ____ Use CHG Soap/SAGE wipes as directed  __X__ Use inhalers on the day of surgery  ____ Stop metformin/Janumet/Farxiga 2 days prior to surgery    ____ Take 1/2 of usual insulin dose the night before surgery. No insulin the morning          of surgery.   ____ Stop Blood Thinners Coumadin/Plavix/Xarelto/Pleta/Pradaxa/Eliquis/Effient/Aspirin  on   Or contact your Surgeon, Cardiologist or Medical Doctor regarding  ability to stop your blood thinners  __X__ Stop Anti-inflammatories 7 days before surgery such as Advil, Ibuprofen, Motrin,  BC or Goodies Powder, Naprosyn, Naproxen, Aleve, Aspirin    __X__ Stop all herbal supplements, fish oil  or vitamin E until after surgery.    ____ Bring C-Pap to the hospital.

## 2021-01-07 ENCOUNTER — Other Ambulatory Visit: Payer: Self-pay

## 2021-01-07 ENCOUNTER — Telehealth: Payer: Self-pay

## 2021-01-07 MED ORDER — LEVOTHYROXINE SODIUM 50 MCG PO TABS: 50.0000 ug | ORAL_TABLET | Freq: Every day | ORAL | 0 refills | Status: DC

## 2021-01-07 NOTE — Telephone Encounter (Signed)
Patient has been sent in Levothyroxine 50 mcg QD 1 tab qty of 5 to hold him over until 7/9 when he can get his prescription refilled and to take 1 50 mcg tab QD

## 2021-01-07 NOTE — Telephone Encounter (Signed)
Patient states that he was taking 2 of 50 mcg tablets

## 2021-01-07 NOTE — Telephone Encounter (Signed)
Synthroid is once a day only not sure why he thinks he was supposed to take it twice a day. Please let him  know thnx.

## 2021-01-07 NOTE — Telephone Encounter (Signed)
Please advise 

## 2021-01-07 NOTE — Telephone Encounter (Signed)
Copied from CRM [id number]. Topic: General - Other >> Jan 07, 2021 11:50 AM Glean Salen wrote: Reason for CBJ:SEGBTDV called about med,levothyroxine (SYNTHROID) 50 MCG tablet , says pharmacy has it for him to take 50mg  1 a day, but he say was told by Dr to take 50 mg twice a day. Please cal back to clarify.

## 2021-01-08 ENCOUNTER — Other Ambulatory Visit: Payer: Self-pay

## 2021-01-08 ENCOUNTER — Ambulatory Visit (INDEPENDENT_AMBULATORY_CARE_PROVIDER_SITE_OTHER): Payer: 59

## 2021-01-08 DIAGNOSIS — R55 Syncope and collapse: Secondary | ICD-10-CM | POA: Diagnosis not present

## 2021-01-08 DIAGNOSIS — R072 Precordial pain: Secondary | ICD-10-CM | POA: Diagnosis not present

## 2021-01-08 LAB — ECHOCARDIOGRAM COMPLETE
AR max vel: 5.65 cm2
AV Area VTI: 5.77 cm2
AV Area mean vel: 5.47 cm2
AV Mean grad: 2 mmHg
AV Peak grad: 4.7 mmHg
Ao pk vel: 1.08 m/s
Area-P 1/2: 3.93 cm2

## 2021-01-08 MED ORDER — PERFLUTREN LIPID MICROSPHERE
1.0000 mL | INTRAVENOUS | Status: AC | PRN
  Administered 2021-01-08: 2 mL via INTRAVENOUS

## 2021-01-12 ENCOUNTER — Encounter
Admission: RE | Disposition: A | Discharge status: Home / Self Care | Payer: Self-pay | Source: Home / Self Care | Attending: Urology

## 2021-01-12 ENCOUNTER — Ambulatory Visit: Payer: 59 | Admitting: Anesthesiology

## 2021-01-12 ENCOUNTER — Telehealth: Payer: Self-pay | Admitting: Cardiology

## 2021-01-12 ENCOUNTER — Ambulatory Visit
Admission: RE | Admit: 2021-01-12 | Discharge: 2021-01-12 | Disposition: A | Discharge status: Home / Self Care | Payer: 59 | Attending: Urology | Admitting: Urology

## 2021-01-12 ENCOUNTER — Encounter: Payer: Self-pay | Admitting: Urology

## 2021-01-12 ENCOUNTER — Other Ambulatory Visit: Payer: Self-pay

## 2021-01-12 ENCOUNTER — Ambulatory Visit: Payer: 59

## 2021-01-12 DIAGNOSIS — R319 Hematuria, unspecified: Secondary | ICD-10-CM | POA: Diagnosis present

## 2021-01-12 DIAGNOSIS — Z87891 Personal history of nicotine dependence: Secondary | ICD-10-CM | POA: Diagnosis not present

## 2021-01-12 DIAGNOSIS — N35912 Unspecified bulbous urethral stricture, male: Secondary | ICD-10-CM | POA: Diagnosis present

## 2021-01-12 DIAGNOSIS — R3129 Other microscopic hematuria: Secondary | ICD-10-CM

## 2021-01-12 HISTORY — PX: CYSTOSCOPY WITH URETHRAL DILATATION: SHX5125

## 2021-01-12 SURGERY — CYSTOSCOPY, WITH URETHRAL DILATION
Anesthesia: General

## 2021-01-12 MED ORDER — ACETAMINOPHEN 10 MG/ML IV SOLN
INTRAVENOUS | Status: AC
  Filled 2021-01-12: qty 100

## 2021-01-12 MED ORDER — LACTATED RINGERS IV SOLN: INTRAVENOUS | Status: DC

## 2021-01-12 MED ORDER — CEFAZOLIN SODIUM-DEXTROSE 2-4 GM/100ML-% IV SOLN
2.0000 g | INTRAVENOUS | Status: AC
Start: 2021-01-12 — End: 2021-01-12
  Administered 2021-01-12: 2 g via INTRAVENOUS

## 2021-01-12 MED ORDER — KETAMINE HCL 50 MG/5ML IJ SOSY
PREFILLED_SYRINGE | INTRAMUSCULAR | Status: AC
  Filled 2021-01-12: qty 5

## 2021-01-12 MED ORDER — STERILE WATER FOR IRRIGATION IR SOLN
Status: DC | PRN
  Administered 2021-01-12: 1000 mL

## 2021-01-12 MED ORDER — PROPOFOL 500 MG/50ML IV EMUL
INTRAVENOUS | Status: DC | PRN
  Administered 2021-01-12: 150 ug/kg/min via INTRAVENOUS

## 2021-01-12 MED ORDER — FENTANYL CITRATE (PF) 100 MCG/2ML IJ SOLN
INTRAMUSCULAR | Status: DC | PRN
  Administered 2021-01-12: 25 ug via INTRAVENOUS
  Administered 2021-01-12: 50 ug via INTRAVENOUS
  Administered 2021-01-12: 25 ug via INTRAVENOUS

## 2021-01-12 MED ORDER — FENTANYL CITRATE (PF) 100 MCG/2ML IJ SOLN
INTRAMUSCULAR | Status: AC
  Filled 2021-01-12: qty 2

## 2021-01-12 MED ORDER — ORAL CARE MOUTH RINSE: 15.0000 mL | Freq: Once | OROMUCOSAL | Status: AC

## 2021-01-12 MED ORDER — CHLORHEXIDINE GLUCONATE 0.12 % MT SOLN: 15.0000 mL | Freq: Once | OROMUCOSAL | Status: AC

## 2021-01-12 MED ORDER — MIDAZOLAM HCL 2 MG/2ML IJ SOLN
INTRAMUSCULAR | Status: AC
  Filled 2021-01-12: qty 2

## 2021-01-12 MED ORDER — IOHEXOL 180 MG/ML  SOLN
INTRAMUSCULAR | Status: DC | PRN
  Administered 2021-01-12: 10 mL

## 2021-01-12 MED ORDER — GLYCOPYRROLATE 0.2 MG/ML IJ SOLN
INTRAMUSCULAR | Status: DC | PRN
  Administered 2021-01-12: .2 mg via INTRAVENOUS

## 2021-01-12 MED ORDER — MIDAZOLAM HCL 2 MG/2ML IJ SOLN
INTRAMUSCULAR | Status: DC | PRN
  Administered 2021-01-12: 2 mg via INTRAVENOUS

## 2021-01-12 MED ORDER — FENTANYL CITRATE (PF) 100 MCG/2ML IJ SOLN: 25.0000 ug | INTRAMUSCULAR | Status: DC | PRN

## 2021-01-12 MED ORDER — KETAMINE HCL 10 MG/ML IJ SOLN
INTRAMUSCULAR | Status: DC | PRN
  Administered 2021-01-12: 10 mg via INTRAVENOUS
  Administered 2021-01-12: 20 mg via INTRAVENOUS

## 2021-01-12 MED ORDER — CEFAZOLIN SODIUM-DEXTROSE 2-4 GM/100ML-% IV SOLN
INTRAVENOUS | Status: AC
  Filled 2021-01-12: qty 100

## 2021-01-12 MED ORDER — PROPOFOL 10 MG/ML IV BOLUS
INTRAVENOUS | Status: AC
  Filled 2021-01-12: qty 20

## 2021-01-12 MED ORDER — CHLORHEXIDINE GLUCONATE 0.12 % MT SOLN
OROMUCOSAL | Status: AC
  Administered 2021-01-12: 15 mL via OROMUCOSAL
  Filled 2021-01-12: qty 15

## 2021-01-12 MED ORDER — OXYCODONE HCL 5 MG/5ML PO SOLN: 5.0000 mg | Freq: Once | ORAL | Status: DC | PRN

## 2021-01-12 MED ORDER — OXYCODONE HCL 5 MG PO TABS: 5.0000 mg | ORAL_TABLET | Freq: Once | ORAL | Status: DC | PRN

## 2021-01-12 MED ORDER — ONDANSETRON HCL 4 MG/2ML IJ SOLN
INTRAMUSCULAR | Status: DC | PRN
  Administered 2021-01-12: 4 mg via INTRAVENOUS

## 2021-01-12 MED ORDER — FAMOTIDINE 20 MG PO TABS
ORAL_TABLET | ORAL | Status: AC
  Administered 2021-01-12: 20 mg via ORAL
  Filled 2021-01-12: qty 1

## 2021-01-12 MED ORDER — FAMOTIDINE 20 MG PO TABS: 20.0000 mg | ORAL_TABLET | Freq: Once | ORAL | Status: AC

## 2021-01-12 MED ORDER — SODIUM CHLORIDE 0.9 % IR SOLN
Status: DC | PRN
  Administered 2021-01-12: 3000 mL

## 2021-01-12 MED ORDER — ONDANSETRON HCL 4 MG/2ML IJ SOLN: 4.0000 mg | Freq: Once | INTRAMUSCULAR | Status: DC | PRN

## 2021-01-12 MED ORDER — ACETAMINOPHEN 10 MG/ML IV SOLN
INTRAVENOUS | Status: DC | PRN
  Administered 2021-01-12: 1000 mg via INTRAVENOUS

## 2021-01-12 MED ORDER — DEXMEDETOMIDINE (PRECEDEX) IN NS 20 MCG/5ML (4 MCG/ML) IV SYRINGE
PREFILLED_SYRINGE | INTRAVENOUS | Status: DC | PRN
  Administered 2021-01-12 (×2): 8 ug via INTRAVENOUS
  Administered 2021-01-12: 12 ug via INTRAVENOUS

## 2021-01-12 MED ORDER — ACETAMINOPHEN 10 MG/ML IV SOLN: 1000.0000 mg | Freq: Once | INTRAVENOUS | Status: DC | PRN

## 2021-01-12 SURGICAL SUPPLY — 22 items
Atrion QL4015 Disposable Inflation Device ×2 IMPLANT
BAG DRN RND TRDRP ANRFLXCHMBR (UROLOGICAL SUPPLIES) ×1
BAG URINE DRAIN 2000ML AR STRL (UROLOGICAL SUPPLIES) ×2 IMPLANT
BALLN OPTILUME DCB 30X5X75 (BALLOONS) ×2
BALLOON OPTILUME DCB 30X5X75 (BALLOONS) ×1 IMPLANT
CATH FOL 2WAY LX 16X5 (CATHETERS) IMPLANT
CATH FOL 2WAY LX 18X30 (CATHETERS) IMPLANT
CATH FOLEY 2W COUNCIL 5CC 18FR (CATHETERS) ×2 IMPLANT
CATH URETHRAL DIL 7.0X29 (CATHETERS) ×2 IMPLANT
ELECT REM PT RETURN 9FT ADLT (ELECTROSURGICAL)
ELECTRODE REM PT RTRN 9FT ADLT (ELECTROSURGICAL) IMPLANT
GAUZE 4X4 16PLY ~~LOC~~+RFID DBL (SPONGE) ×4 IMPLANT
GLOVE SURG ENC MOIS LTX SZ6.5 (GLOVE) ×2 IMPLANT
GOWN STRL REUS W/ TWL LRG LVL3 (GOWN DISPOSABLE) ×2 IMPLANT
GOWN STRL REUS W/TWL LRG LVL3 (GOWN DISPOSABLE) ×4
GUIDEWIRE STR DUAL SENSOR (WIRE) ×2 IMPLANT
MANIFOLD NEPTUNE II (INSTRUMENTS) ×2 IMPLANT
PACK CYSTO AR (MISCELLANEOUS) ×2 IMPLANT
SET CYSTO W/LG BORE CLAMP LF (SET/KITS/TRAYS/PACK) ×2 IMPLANT
SYR 10ML LL (SYRINGE) ×2 IMPLANT
SYR 30ML LL (SYRINGE) ×2 IMPLANT
WATER STERILE IRR 1000ML POUR (IV SOLUTION) ×2 IMPLANT

## 2021-01-12 NOTE — H&P (Signed)
    01/12/21 R[facility] CTAB  H&P updated today, no changes in medical history    CC:     Chief Complaint  Patient presents with   Cysto      HPI: 60 year old male with hematuria who presents today for cystoscopy.   Since last visit, he underwent CT urogram on 11/19/2020 which showed 3 mm right lower pole stone as well as a simple right renal cyst measuring 2.3 cm.  There are some distortion of the right lateral bladder base.   Blood pressure 129/82, pulse 90, height 6' (1.829 m), weight 261 lb (118.4 kg). NED. A&Ox3.   No respiratory distress   Abd soft, NT, ND Normal phallus with bilateral descended testicles   Cystoscopy Procedure Note   Patient identification was confirmed, informed consent was obtained, and patient was prepped using Betadine solution.  Lidocaine jelly was administered per urethral meatus.       Pre-Procedure: - Inspection reveals a normal caliber ureteral meatus.   Procedure: The flexible cystoscope was introduced without difficulty -Within the bulbar urethra, there was an approximately 5 [place] nondense relatively short appearing bulbar urethral stricture however this was too small to accommodate the flexible cystoscope.  As such, the procedure was aborted.     Post-Procedure: - Patient tolerated the procedure well   Assessment/ Plan:   1. Microscopic hematuria Unable to completely evaluate bladder secondary to urethral stricture which in fact may be the underlying cause of microscopic blood   Recommend cystoscopy the time of urethral dilation as outlined below with intervention as deemed necessary - Urinalysis, Complete - CULTURE, URINE COMPREHENSIVE   2. 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  We discussed in office dilation versus proceeding to the operating room for dilation and/or DVIU versus urethroplasty.   He is most interested in proceeding to the operating room with some  sedation in order to dilate the urethra with a balloon (Optilume).  We discussed the need for Foley catheter for 24 hours post procedure.  We discussed the risk including recurrence of the stricture, damage surrounding structures, discomfort, bleeding, infection amongst others.  All questions answered.  We will proceed with cystoscopy at the same time to rule out any underlying bladder pathology.  If bladder pathology is identified, this could also be addressed intraoperatively.  He is agreeable this plan.   3. Kidney stones Incidental asymptomatic 3 mm stone, recommend observation

## 2021-01-12 NOTE — Telephone Encounter (Signed)
Attempted to contact. No ans no vm  

## 2021-01-12 NOTE — Telephone Encounter (Signed)
-----   Message from Festus Aloe, CMA sent at 01/12/2021 11:57 AM EDT ----- The patient is scheduled for a follow up cardiac testing with Dr. Azucena Cecil on July 14th. Please schedule the Myoview and then have the patient come for a visit after the [facility] unless the patient is complaining of cardiac problems.  Thanks, Jasmine December

## 2021-01-12 NOTE — Anesthesia Postprocedure Evaluation (Signed)
Anesthesia Post Note  Patient: Radford Pease  Procedure(s) Performed: CYSTOSCOPY WITH URETHRAL DILATATION WITH OPTILUME BALLOON  Patient location during evaluation: PACU Anesthesia Type: General Level of consciousness: awake and alert Pain management: pain level controlled Vital Signs Assessment: post-procedure vital signs reviewed and stable Respiratory status: spontaneous breathing, nonlabored ventilation, respiratory function stable and patient connected to nasal cannula oxygen Cardiovascular status: blood pressure returned to baseline and stable Postop Assessment: no apparent nausea or vomiting Anesthetic complications: no   No notable events documented.   Last Vitals:  Vitals:   01/12/21 1245 01/12/21 1314  BP: 117/85 (!) 158/92  Pulse: 79 71  Resp:  20  Temp:  (!) 36.1 C  SpO2: 97% 99%    Last Pain:  Vitals:   01/12/21 1314  TempSrc: Temporal  PainSc:                  Corinda Gubler

## 2021-01-12 NOTE — Transfer of Care (Signed)
Immediate Anesthesia Transfer of Care Note  Patient: Om Lizotte  Procedure(s) Performed: CYSTOSCOPY WITH URETHRAL DILATATION WITH OPTILUME BALLOON  Patient Location: PACU  Anesthesia Type:General  Level of Consciousness: awake, alert  and patient cooperative  Airway & Oxygen Therapy: Patient Spontanous Breathing and Patient connected to face mask oxygen  Post-op Assessment: Report given to RN and Post -op Vital signs reviewed and stable  Post vital signs: Reviewed and stable  Last Vitals:  Vitals Value Taken Time  BP 133/99 01/12/21 1157  Temp    Pulse 103 01/12/21 1158  Resp 19 01/12/21 1158  SpO2 100 % 01/12/21 1158  Vitals shown include unvalidated device data.  Last Pain:  Vitals:   01/12/21 1025  TempSrc: Oral  PainSc: 2          Complications: No notable events documented.

## 2021-01-12 NOTE — Op Note (Signed)
Date of procedure: 01/12/21  Preoperative diagnosis:  Bulbar urethral stricture History of hematuria  Postoperative diagnosis:  Same as above  Procedure: Cystoscopy Balloon dilation of bulbar urethral stricture  Surgeon: Vanna Scotland, MD  Anesthesia: General  Complications: None  Intraoperative findings: Long at least 2 cm bulbar urethral stricture, approximately 8 [place] which was not dense.  No bladder pathology identified.  EBL: Minimal  Specimens: None  Drains: 18 French council tip Foley catheter  Indication: Isaiah Brown is a 60 y.o. patient with extensive smoking history and hematuria found to have a bulbar urethral stricture which was unable to be bypassed to evaluate bladder.  As such, he is elected to undergo urethral dilation of his stricture today.  After reviewing the management options for treatment, he elected to proceed with the above surgical procedure(s). We have discussed the potential benefits and risks of the procedure, side effects of the proposed treatment, the likelihood of the patient achieving the goals of the procedure, and any potential problems that might occur during the procedure or recuperation. Informed consent has been obtained.  Description of procedure:  The patient was taken to the operating room and general anesthesia was induced.  The patient was placed in the dorsal lithotomy position, prepped and draped in the usual sterile fashion, and preoperative antibiotics were administered. A preoperative time-out was performed.   A 21 French scope was advanced per urethra to the bulb where an approximately 8 French urethral stricture was identified.  Which was previously felt to be short, went appearing into the lumen of this with a larger cystoscope with the ability to further distend the urethra, it appeared that the stricture was actually much longer, probably about 2 cm in length.  It was relatively nontense.  I was unable to pass the scope  through the stricture.  As such, I advanced a sensor wire to level the bladder confirmed fluoroscopically.  I then used a Cook urethral balloon dilator to dilate the bulbar urethra.  I then was able to pass a 21 French cystoscope through the strictured bulbar urethra to confirm that in fact it was approximately 2 cm in length but I dilated quite nicely.  Prostate was fairly unremarkable with bilobar coaptation.  The bladder was carefully inspected and was also unremarkable with no tumors, masses, or lesions.  There is some erythema in the posterior bladder wall where the wire was seen mechanically rubbing but otherwise no other concerning findings.  The bladder was then drained and the catheter was removed.  I then used the OPTi lumen urethral balloon in 2 locations to ensure that it spanned across the bulbar urethral stricture.  I elected to use a 3 cm balloon, 30 French in diameter.  Each time the balloon was inflated, it was done so with contrast in the balloon so that he had I was able to visualize the location of dilation.  I overlapped small amount in the middle.  I left the balloon inflated for at least 5 minutes at each location.  Finally, this balloon was removed leaving the wire only in place.  I then advanced a council tip catheter, 18 French over the wire into the bladder.  The balloon was inflated with 10 cc of sterile water and the wire was removed.  The patient was then cleaned and dried, repositioned in supine position, reversed from anesthesia, and taken to the PACU in stable condition.  Plan: The patient has elected to remove his own catheter tomorrow.  I will see  him back in about 6 weeks for IPSS/PVR.  Vanna Scotland, M.D.

## 2021-01-12 NOTE — Anesthesia Preprocedure Evaluation (Signed)
Anesthesia Evaluation  Patient identified by MRN, date of birth, ID band Patient awake    Reviewed: Allergy & Precautions, NPO status , Patient's Chart, lab work & pertinent test results  History of Anesthesia Complications Negative for: history of anesthetic complications  Airway Mallampati: II  TM Distance: >3 FB Neck ROM: Full    Dental  (+) Teeth Intact, Poor Dentition, Missing,    Pulmonary sleep apnea , COPD, Current SmokerPatient did not abstain from smoking.,  OSA listed in PMHx, but patient denies   Pulmonary exam normal breath sounds clear to auscultation       Cardiovascular Exercise Tolerance: Good METShypertension, (-) CAD and (-) Past MI (-) dysrhythmias  Rhythm:Regular Rate:Normal - Systolic murmurs 5284: unremarkable TTE   Neuro/Psych PSYCHIATRIC DISORDERS Anxiety  Neuromuscular disease    GI/Hepatic neg GERD  ,(+)     (-) substance abuse  ,   Endo/Other  neg diabetes  Renal/GU negative Renal ROS     Musculoskeletal   Abdominal   Peds  Hematology   Anesthesia Other Findings Past Medical History: No date: Anxiety No date: Chronic radicular lumbar pain No date: COPD (chronic obstructive pulmonary disease) (HCC) No date: Hypertension No date: Sleep apnea     Comment:  not using CPAP at this time  Reproductive/Obstetrics                             Anesthesia Physical Anesthesia Plan  ASA: 3  Anesthesia Plan: General   Post-op Pain Management:    Induction: Intravenous  PONV Risk Score and Plan: 2 and Ondansetron, Propofol infusion, TIVA, Dexamethasone, Treatment may vary due to age or medical condition and Midazolam  Airway Management Planned: Nasal Cannula and Natural Airway  Additional Equipment: None  Intra-op Plan:   Post-operative Plan:   Informed Consent: I have reviewed the patients History and Physical, chart, labs and discussed the procedure  including the risks, benefits and alternatives for the proposed anesthesia with the patient or authorized representative who has indicated his/her understanding and acceptance.     Dental advisory given  Plan Discussed with: CRNA and Surgeon  Anesthesia Plan Comments: (Discussed risks of anesthesia with patient, including possibility of difficulty with spontaneous ventilation under anesthesia necessitating airway intervention, PONV, and rare risks such as cardiac or respiratory or neurological events. Patient understands.)        Anesthesia Quick Evaluation

## 2021-01-12 NOTE — Discharge Instructions (Addendum)
You have a Foley catheter in place.  It is normal to see blood around the catheter at the tip of your penis.  Tomorrow midday, you may use the syringe provided by the nurse to deflate the balloon which is anchoring your Foley.  Once you draw back all of the fluid in the balloon, pulled gently until the entire Foley is removed.  If you have any trouble removing the catheter, are happy to assist you in clinic. AMBULATORY SURGERY  DISCHARGE INSTRUCTIONS   The drugs that you were given will stay in your system until tomorrow so for the next 24 hours you should not:  Drive an automobile Make any legal decisions Drink any alcoholic beverage   You may resume regular meals tomorrow.  Today it is better to start with liquids and gradually work up to solid foods.  You may eat anything you prefer, but it is better to start with liquids, then soup and crackers, and gradually work up to solid foods.   Please notify your doctor immediately if you have any unusual bleeding, trouble breathing, redness and pain at the surgery site, drainage, fever, or pain not relieved by medication.    Additional Instructions:        Please contact your physician with any problems or Same Day Surgery at [phone], Monday through Friday 6 am to 4 pm, or [place] at [facility] number at [phone].

## 2021-01-12 NOTE — Progress Notes (Signed)
PAtient and his sister verbalize understanding of foley and how to empty it, as well as how to remove it

## 2021-01-13 ENCOUNTER — Encounter: Payer: 59 | Admitting: Urology

## 2021-01-15 ENCOUNTER — Ambulatory Visit: Payer: 59 | Admitting: Cardiology

## 2021-01-19 ENCOUNTER — Other Ambulatory Visit: Payer: Self-pay

## 2021-01-19 ENCOUNTER — Other Ambulatory Visit: Payer: 59

## 2021-01-19 DIAGNOSIS — Z125 Encounter for screening for malignant neoplasm of prostate: Secondary | ICD-10-CM

## 2021-01-19 DIAGNOSIS — E785 Hyperlipidemia, unspecified: Secondary | ICD-10-CM

## 2021-01-19 DIAGNOSIS — I10 Essential (primary) hypertension: Secondary | ICD-10-CM

## 2021-01-19 DIAGNOSIS — Z131 Encounter for screening for diabetes mellitus: Secondary | ICD-10-CM

## 2021-01-19 DIAGNOSIS — R55 Syncope and collapse: Secondary | ICD-10-CM

## 2021-01-19 DIAGNOSIS — E039 Hypothyroidism, unspecified: Secondary | ICD-10-CM

## 2021-01-19 LAB — BAYER DCA HB A1C WAIVED: HB A1C (BAYER DCA - WAIVED): 5.2 % (ref <7.0)

## 2021-01-20 LAB — T4, FREE: Free T4: 1 ng/dL (ref 0.82–1.77)

## 2021-01-20 LAB — CBC WITH DIFFERENTIAL/PLATELET
Basophils Absolute: 0.1 10*3/uL (ref 0.0–0.2)
Basos: 1 %
EOS (ABSOLUTE): 0.1 10*3/uL (ref 0.0–0.4)
Eos: 1 %
Hematocrit: 57.5 % — ABNORMAL HIGH (ref 37.5–51.0)
Hemoglobin: 19.9 g/dL — ABNORMAL HIGH (ref 13.0–17.7)
Immature Grans (Abs): 0 10*3/uL (ref 0.0–0.1)
Immature Granulocytes: 1 %
Lymphocytes Absolute: 1.9 10*3/uL (ref 0.7–3.1)
Lymphs: 21 %
MCH: 30.8 pg (ref 26.6–33.0)
MCHC: 34.6 g/dL (ref 31.5–35.7)
MCV: 89 fL (ref 79–97)
Monocytes Absolute: 0.9 10*3/uL (ref 0.1–0.9)
Monocytes: 10 %
Neutrophils Absolute: 5.9 10*3/uL (ref 1.4–7.0)
Neutrophils: 66 %
Platelets: 214 10*3/uL (ref 150–450)
RBC: 6.47 x10E6/uL — ABNORMAL HIGH (ref 4.14–5.80)
RDW: 13.8 % (ref 11.6–15.4)
WBC: 8.9 10*3/uL (ref 3.4–10.8)

## 2021-01-20 LAB — COMPREHENSIVE METABOLIC PANEL
ALT: 23 IU/L (ref 0–44)
AST: 24 IU/L (ref 0–40)
Albumin/Globulin Ratio: 1.7 (ref 1.2–2.2)
Albumin: 4.7 g/dL (ref 3.8–4.8)
Alkaline Phosphatase: 82 IU/L (ref 44–121)
BUN/Creatinine Ratio: 23 (ref 10–24)
BUN: 24 mg/dL (ref 8–27)
Bilirubin Total: 0.4 mg/dL (ref 0.0–1.2)
CO2: 21 mmol/L (ref 20–29)
Calcium: 9.6 mg/dL (ref 8.6–10.2)
Chloride: 104 mmol/L (ref 96–106)
Creatinine, Ser: 1.04 mg/dL (ref 0.76–1.27)
Globulin, Total: 2.7 g/dL (ref 1.5–4.5)
Glucose: 82 mg/dL (ref 65–99)
Potassium: 4.1 mmol/L (ref 3.5–5.2)
Sodium: 143 mmol/L (ref 134–144)
Total Protein: 7.4 g/dL (ref 6.0–8.5)
eGFR: 81 mL/min/{1.73_m2} (ref >59)

## 2021-01-20 LAB — LIPID PANEL
Chol/HDL Ratio: 6 ratio — ABNORMAL HIGH (ref 0.0–5.0)
Cholesterol, Total: 235 mg/dL — ABNORMAL HIGH (ref 100–199)
HDL: 39 mg/dL — ABNORMAL LOW (ref >39)
LDL Chol Calc (NIH): 160 mg/dL — ABNORMAL HIGH (ref 0–99)
Triglycerides: 196 mg/dL — ABNORMAL HIGH (ref 0–149)
VLDL Cholesterol Cal: 36 mg/dL (ref 5–40)

## 2021-01-20 LAB — PSA TOTAL+% FREE (SERIAL)
PSA, Free Pct: 21.5 %
PSA, Free: 0.28 ng/mL
Prostate Specific Ag, Serum: 1.3 ng/mL (ref 0.0–4.0)

## 2021-01-20 LAB — THYROID PANEL WITH TSH
Free Thyroxine Index: 1.7 (ref 1.2–4.9)
T3 Uptake Ratio: 23 % — ABNORMAL LOW (ref 24–39)
T4, Total: 7.3 ug/dL (ref 4.5–12.0)
TSH: 9.53 u[IU]/mL — ABNORMAL HIGH (ref 0.450–4.500)

## 2021-01-26 ENCOUNTER — Other Ambulatory Visit: Payer: Self-pay

## 2021-01-26 ENCOUNTER — Ambulatory Visit (INDEPENDENT_AMBULATORY_CARE_PROVIDER_SITE_OTHER): Payer: 59 | Admitting: Internal Medicine

## 2021-01-26 ENCOUNTER — Encounter: Payer: Self-pay | Admitting: Internal Medicine

## 2021-01-26 VITALS — BP 133/82 | HR 74 | Temp 99.5°F | Ht 72.44 in | Wt 262.2 lb

## 2021-01-26 DIAGNOSIS — E785 Hyperlipidemia, unspecified: Secondary | ICD-10-CM | POA: Diagnosis not present

## 2021-01-26 DIAGNOSIS — E039 Hypothyroidism, unspecified: Secondary | ICD-10-CM | POA: Diagnosis not present

## 2021-01-26 DIAGNOSIS — I10 Essential (primary) hypertension: Secondary | ICD-10-CM | POA: Diagnosis not present

## 2021-01-26 MED ORDER — SPIRIVA RESPIMAT 2.5 MCG/ACT IN AERS: 2.0000 | INHALATION_SPRAY | Freq: Every day | RESPIRATORY_TRACT | 6 refills | Status: DC

## 2021-01-26 MED ORDER — LEVOTHYROXINE SODIUM 75 MCG PO TABS: 75.0000 ug | ORAL_TABLET | Freq: Every day | ORAL | 4 refills | Status: DC

## 2021-01-26 MED ORDER — ROSUVASTATIN CALCIUM 10 MG PO TABS: 10.0000 mg | ORAL_TABLET | Freq: Every day | ORAL | 3 refills | Status: DC

## 2021-01-26 NOTE — Patient Instructions (Signed)
  HYPOTHYROIDISM PLEASE TAKE YOUR THYROID MEDICATION FIRST THING IN THE MORNING WHILST FASTING.  NO MEDICATION/ FOOD FOR AN HOUR AFTER INGESTING THYROID PILLS.

## 2021-01-26 NOTE — Progress Notes (Signed)
BP 133/82   Pulse 74   Temp 99.5 F (37.5 C) (Oral)   Ht 6' 0.44" (1.84 m)   Wt 262 lb 3.2 oz (118.9 kg)   SpO2 96%   BMI 35.13 kg/m    Subjective:    Patient ID: Isaiah Brown, male    DOB: [birth year], 60 y.o.   MRN: [id number]  Chief Complaint  Patient presents with  . Hypertension  . Hypothyroidism  . Emphysema  . Review labs  . Hyperlipidemia  . Fatigue    HPI: Isaiah Brown is a 60 y.o. male  HAD A URETHRAL stricture , urinary flow wasn't normal, had a Relatively tight bulbar urethral stricture- Balloon dilation of bulbar urethral stricture  Back pain - seperated disc L4- L5 per pts verbal record. Has seen [facility] in the past n, declined surgery saw them before started seeing me.    Hypertension This is a chronic problem. The problem is controlled. Pertinent negatives include no anxiety, blurred vision, malaise/fatigue, neck pain, orthopnea, peripheral edema, PND or shortness of breath. Identifiable causes of hypertension include a thyroid problem.  Hyperlipidemia This is a chronic problem. Pertinent negatives include no shortness of breath.  Thyroid Problem Presents for follow-up visit. Symptoms include dry skin, fatigue and hair loss. Patient reports no anxiety, hoarse voice, visual change, weight gain or weight loss. His past medical history is significant for hyperlipidemia.   Chief Complaint  Patient presents with  . Hypertension  . Hypothyroidism  . Emphysema  . Review labs  . Hyperlipidemia  . Fatigue    Relevant past medical, surgical, family and social history reviewed and updated as indicated. Interim medical history since our last visit reviewed. Allergies and medications reviewed and updated.  Review of Systems  Constitutional:  Positive for fatigue. Negative for malaise/fatigue, weight gain and weight loss.  HENT:  Negative for hoarse voice.   Eyes:  Negative for blurred vision.  Respiratory:  Negative for shortness of breath.    Cardiovascular:  Negative for orthopnea and PND.  Musculoskeletal:  Negative for neck pain.  Psychiatric/Behavioral:  The patient is not nervous/anxious.    Per HPI unless specifically indicated above     Objective:    BP 133/82   Pulse 74   Temp 99.5 F (37.5 C) (Oral)   Ht 6' 0.44" (1.84 m)   Wt 262 lb 3.2 oz (118.9 kg)   SpO2 96%   BMI 35.13 kg/m   Wt Readings from Last 3 Encounters:  01/26/21 262 lb 3.2 oz (118.9 kg)  01/12/21 261 lb 14.5 oz (118.8 kg)  01/02/21 262 lb (118.8 kg)    Physical Exam Vitals and nursing note reviewed.  Constitutional:      General: He is not in acute distress.    Appearance: Normal appearance. He is not ill-appearing or diaphoretic.  HENT:     Head: Normocephalic and atraumatic.     Right Ear: Tympanic membrane and external ear normal. There is no impacted cerumen.     Left Ear: External ear normal.     Nose: No congestion or rhinorrhea.     Mouth/Throat:     Pharynx: No oropharyngeal exudate or posterior oropharyngeal erythema.  Eyes:     Conjunctiva/sclera: Conjunctivae normal.     Pupils: Pupils are equal, round, and reactive to light.  Cardiovascular:     Rate and Rhythm: Normal rate and regular rhythm.     Heart sounds: No murmur heard.   No friction rub. No gallop.  Pulmonary:     Effort: No respiratory distress.     Breath sounds: No stridor. No wheezing or rhonchi.  Chest:     Chest wall: No tenderness.  Abdominal:     General: Abdomen is flat. Bowel sounds are normal.     Palpations: Abdomen is soft. There is no mass.     Tenderness: There is no abdominal tenderness.  Musculoskeletal:     Cervical back: Normal range of motion and neck supple. No rigidity or tenderness.     Left lower leg: No edema.  Skin:    General: Skin is warm and dry.  Neurological:     Mental Status: He is alert.   Results for orders placed or performed in visit on 01/19/21  Bayer DCA Hb A1c Waived  Result Value Ref Range   HB A1C (BAYER  DCA - WAIVED) 5.2 <7.0 %  T4, free  Result Value Ref Range   Free T4 1.00 0.82 - 1.77 ng/dL  Lipid panel  Result Value Ref Range   Cholesterol, Total 235 (H) 100 - 199 mg/dL   Triglycerides 196 (H) 0 - 149 mg/dL   HDL 39 (L) >39 mg/dL   VLDL Cholesterol Cal 36 5 - 40 mg/dL   LDL Chol Calc (NIH) 160 (H) 0 - 99 mg/dL   Chol/HDL Ratio 6.0 (H) 0.0 - 5.0 ratio  Thyroid Panel With TSH  Result Value Ref Range   TSH 9.530 (H) 0.450 - 4.500 uIU/mL   T4, Total 7.3 4.5 - 12.0 ug/dL   T3 Uptake Ratio 23 (L) 24 - 39 %   Free Thyroxine Index 1.7 1.2 - 4.9  CBC with Differential/Platelet  Result Value Ref Range   WBC 8.9 3.4 - 10.8 x10E3/uL   RBC 6.47 (H) 4.14 - 5.80 x10E6/uL   Hemoglobin 19.9 (H) 13.0 - 17.7 g/dL   Hematocrit 57.5 (H) 37.5 - 51.0 %   MCV 89 79 - 97 fL   MCH 30.8 26.6 - 33.0 pg   MCHC 34.6 31.5 - 35.7 g/dL   RDW 13.8 11.6 - 15.4 %   Platelets 214 150 - 450 x10E3/uL   Neutrophils 66 Not Estab. %   Lymphs 21 Not Estab. %   Monocytes 10 Not Estab. %   Eos 1 Not Estab. %   Basos 1 Not Estab. %   Neutrophils Absolute 5.9 1.4 - 7.0 x10E3/uL   Lymphocytes Absolute 1.9 0.7 - 3.1 x10E3/uL   Monocytes Absolute 0.9 0.1 - 0.9 x10E3/uL   EOS (ABSOLUTE) 0.1 0.0 - 0.4 x10E3/uL   Basophils Absolute 0.1 0.0 - 0.2 x10E3/uL   Immature Granulocytes 1 Not Estab. %   Immature Grans (Abs) 0.0 0.0 - 0.1 x10E3/uL  Comprehensive metabolic panel  Result Value Ref Range   Glucose 82 65 - 99 mg/dL   BUN 24 8 - 27 mg/dL   Creatinine, Ser 1.04 0.76 - 1.27 mg/dL   eGFR 81 >59 mL/min/1.73   BUN/Creatinine Ratio 23 10 - 24   Sodium 143 134 - 144 mmol/L   Potassium 4.1 3.5 - 5.2 mmol/L   Chloride 104 96 - 106 mmol/L   CO2 21 20 - 29 mmol/L   Calcium 9.6 8.6 - 10.2 mg/dL   Total Protein 7.4 6.0 - 8.5 g/dL   Albumin 4.7 3.8 - 4.8 g/dL   Globulin, Total 2.7 1.5 - 4.5 g/dL   Albumin/Globulin Ratio 1.7 1.2 - 2.2   Bilirubin Total 0.4 0.0 - 1.2 mg/dL   Alkaline Phosphatase  82 44 - 121 IU/L   AST  24 0 - 40 IU/L   ALT 23 0 - 44 IU/L  PSA Total+%Free (Serial)  Result Value Ref Range   Prostate Specific Ag, Serum 1.3 0.0 - 4.0 ng/mL   PSA, Free 0.28 N/A ng/mL   PSA, Free Pct 21.5 %        Current Outpatient Medications:  .  acetaminophen (TYLENOL) 500 MG tablet, Take 2,000 mg by mouth every 6 (six) hours as needed for moderate pain., Disp: , Rfl:  .  albuterol (PROVENTIL) (2.5 MG/3ML) 0.083% nebulizer solution, Take 3 mLs (2.5 mg total) by nebulization every 6 (six) hours as needed for wheezing or shortness of breath., Disp: 75 mL, Rfl: 12 .  albuterol (VENTOLIN HFA) 108 (90 Base) MCG/ACT inhaler, Inhale 2 puffs into the lungs every 6 (six) hours as needed for wheezing or shortness of breath., Disp: 8 g, Rfl: 0 .  levothyroxine (SYNTHROID) 50 MCG tablet, Take 1 tablet (50 mcg total) by mouth daily., Disp: 30 tablet, Rfl: 3 .  Multiple Vitamins-Minerals (MULTIVITAMIN WITH MINERALS) tablet, Take 1 tablet by mouth daily., Disp: , Rfl:  .  Tiotropium Bromide Monohydrate (SPIRIVA RESPIMAT) 2.5 MCG/ACT AERS, Inhale 2 puffs into the lungs daily. (Patient taking differently: Inhale 2 puffs into the lungs daily as needed.), Disp: 1 each, Rfl: 6 .  valsartan-hydrochlorothiazide (DIOVAN-HCT) 80-12.5 MG tablet, Take 1 tablet by mouth daily., Disp: 90 tablet, Rfl: 2    Assessment & Plan:   HLD LDL is very high will start pt on crestor  Ref. Range 12/18/2020 11:41 01/08/2021 12:20 01/12/2021 11:02 01/19/2021 10:39 01/19/2021 10:44  Total CHOL/HDL Ratio Latest Ref Range: 0.0 - 5.0 ratio 6.0 (H)    6.0 (H)  Cholesterol, Total Latest Ref Range: 100 - 199 mg/dL 227 (H)    235 (H)  HDL Cholesterol Latest Ref Range: >39 mg/dL 38 (L)    39 (L)  Triglycerides Latest Ref Range: 0 - 149 mg/dL 166 (H)    196 (H)  VLDL Cholesterol Cal Latest Ref Range: 5 - 40 mg/dL 30    36  LDL Chol Calc (NIH) Latest Ref Range: 0 - 99 mg/dL 159 (H)    160 (H)   2. Atrophic thyroid per US thyroid Increase dose to 75 mcg.   PLEASE TAKE YOUR THYROID MEDICATION FIRST THING IN THE MORNING WHILST FASTING.  NO MEDICATION/ FOOD FOR AN HOUR AFTER INGESTING THYROID PILLS.  3. COPD : Defers PFTs continues to smoke 1 - 1/2 ppd  RBC elevated so also HCT  Consider heme onc referral if high.  4. Smoking cessation : Smoking cessation advised. Is on nicotine patches in the past. continues to smoke. more than > 5 - 10 mins of time was spent with pt regarding smoking cessation and complications.   5. Back pain no pain at current time  sees [facility] for such fu and mx per them, no records avaialble here. Pt saw them in [place] feb before he established care with me.  Unsure of diagnosis, needs records. Fu and management per them.      Problem List Items Addressed This Visit   None    Orders Placed This Encounter  Procedures  . CBC with Differential/Platelet  . Thyroid Panel With TSH  . Lipid panel  . CMP14+EGFR  . T4, free     Meds ordered this encounter  Medications  . levothyroxine (SYNTHROID) 75 MCG tablet    Sig: Take 1 tablet (75  mcg total) by mouth daily.    Dispense:  30 tablet    Refill:  4  . rosuvastatin (CRESTOR) 10 MG tablet    Sig: Take 1 tablet (10 mg total) by mouth daily.    Dispense:  30 tablet    Refill:  3  . Tiotropium Bromide Monohydrate (SPIRIVA RESPIMAT) 2.5 MCG/ACT AERS    Sig: Inhale 2 puffs into the lungs daily.    Dispense:  1 each    Refill:  6     Follow up plan: No follow-ups on file.

## 2021-02-23 NOTE — Progress Notes (Signed)
02/25/21 1:09 PM   Isaiah Brown 24-Dec-1960 [id number]  Referring provider:  Loura Pardon, MD [address] [place],  [place] [address] Chief Complaint  Patient presents with   Routine Post Op     HPI: Isaiah Brown is a 60 y.o.male  who returns for a 6 week post-op follow-up for IPSS and PVR status post urethral dilation and cystoscopy.  This was initially identified in a work-up for hematuria.  His pre-op symptoms included inconsistent stream and was found to have a urethral sticture  11/29/2020 CT urogram showed a 3 mm right lower pole stone as well as a simple right renal cyst measuring 2.3 cm. There are more distortion of the right lateral bladder base.   On 01/12/2021 he underwent cystoscopy with urethral dilation with optilume balloon wth Dr. Vanna Scotland.   IPSS score today was 4.    Overall, he reports an improvement in his urinary stream.  He no longer has trickling or stopping starting of his stream.  The caliber is also improved.  He is pleased with the result.  PSA total trend:  Component     Latest Ref Rng & Units 10/01/2020 01/19/2021  Prostate Specific Ag, Serum     0.0 - 4.0 ng/mL 0.5 1.3  PSA, Free     N/A ng/mL  0.28  PSA, Free Pct     %  21.5     IPSS     Row Name 02/25/21 1100         International Prostate Symptom Score   How often have you had the sensation of not emptying your bladder? Not at All     How often have you had to urinate less than every two hours? Less than 1 in 5 times     How often have you found you stopped and started again several times when you urinated? Less than half the time     How often have you found it difficult to postpone urination? Not at All     How often have you had a weak urinary stream? Less than 1 in 5 times     How often have you had to strain to start urination? Not at All     How many times did you typically get up at night to urinate? None     Total IPSS Score 4           Quality of Life due to urinary  symptoms   If you were to spend the rest of your life with your urinary condition just the way it is now how would you feel about that? Mostly Satisfied              IPSS     Row Name 02/25/21 1100         International Prostate Symptom Score   How often have you had the sensation of not emptying your bladder? Not at All     How often have you had to urinate less than every two hours? Less than 1 in 5 times     How often have you found you stopped and started again several times when you urinated? Less than half the time     How often have you found it difficult to postpone urination? Not at All     How often have you had a weak urinary stream? Less than 1 in 5 times     How often have you had to strain to start urination?  Not at All     How many times did you typically get up at night to urinate? None     Total IPSS Score 4           Quality of Life due to urinary symptoms   If you were to spend the rest of your life with your urinary condition just the way it is now how would you feel about that? Mostly Satisfied              Score:  1-7 Mild 8-19 Moderate 20-35 Severe     PMH: Past Medical History:  Diagnosis Date   Anxiety    Chronic radicular lumbar pain    COPD (chronic obstructive pulmonary disease) (HCC)    Hypertension    Sleep apnea    not using CPAP at this time    Surgical History: Past Surgical History:  Procedure Laterality Date   APPENDECTOMY     CHOLECYSTECTOMY     CYSTOSCOPY WITH URETHRAL DILATATION N/A 01/12/2021   Procedure: CYSTOSCOPY WITH URETHRAL DILATATION WITH OPTILUME BALLOON;  Surgeon: Vanna Scotland, MD;  Location: [facility] ORS;  Service: Urology;  Laterality: N/A;   HERNIA REPAIR     MOUTH SURGERY Left    Cyst on gum   SKIN CANCER EXCISION Left 2018   [facility] Medications:  Allergies as of 02/25/2021       Reactions   Other Rash   Nicotine Patch        Medication List        Accurate as of February 25, 2021   1:09 PM. If you have any questions, ask your nurse or doctor.          acetaminophen 500 MG tablet Commonly known as: TYLENOL Take 2,000 mg by mouth every 6 (six) hours as needed for moderate pain.   albuterol (2.5 MG/3ML) 0.083% nebulizer solution Commonly known as: PROVENTIL Take 3 mLs (2.5 mg total) by nebulization every 6 (six) hours as needed for wheezing or shortness of breath.   albuterol 108 (90 Base) MCG/ACT inhaler Commonly known as: VENTOLIN HFA Inhale 2 puffs into the lungs every 6 (six) hours as needed for wheezing or shortness of breath.   levothyroxine 75 MCG tablet Commonly known as: SYNTHROID Take 1 tablet (75 mcg total) by mouth daily.   multivitamin with minerals tablet Take 1 tablet by mouth daily.   rosuvastatin 10 MG tablet Commonly known as: Crestor Take 1 tablet (10 mg total) by mouth daily.   Spiriva Respimat 2.5 MCG/ACT Aers Generic drug: Tiotropium Bromide Monohydrate Inhale 2 puffs into the lungs daily.   valsartan-hydrochlorothiazide 80-12.5 MG tablet Commonly known as: DIOVAN-HCT Take 1 tablet by mouth daily.        Allergies:  Allergies  Allergen Reactions   Other Rash    Nicotine Patch    Family History: Family History  Adopted: Yes  Family history unknown: Yes    Social History:  reports that he has been smoking cigars and cigarettes. He has a 46.00 pack-year smoking history. He has never used smokeless tobacco. He reports that he does not currently use alcohol. He reports that he does not use drugs.   Physical Exam: BP 126/84   Pulse 88   Ht 6' 0.44" (1.84 m)   Wt 262 lb (118.8 kg)   BMI 35.10 kg/m   Constitutional:  Alert and oriented, No acute distress. HEENT: [place] AT, moist mucus membranes.  Trachea midline, no masses. Cardiovascular: No  clubbing, cyanosis, or edema. Respiratory: Normal respiratory effort, no increased work of breathing. Skin: No rashes, bruises or suspicious lesions. Neurologic: Grossly intact,  no focal deficits, moving all 4 extremities. Psychiatric: Normal mood and affect.  Laboratory Data:  Lab Results  Component Value Date   CREATININE 1.04 01/19/2021     Lab Results  Component Value Date   HGBA1C 5.2 01/19/2021      Pertinent Imaging: Results for orders placed or performed in visit on 02/25/21  Bladder Scan (Post Void Residual) in office  Result Value Ref Range   Scan Result 4      Assessment & Plan:    History of microscopic hematuria  - Follow-up in year with UA  -Status post cystoscopy and CT urogram, possibly related to urethral stricture disease  History of stricture of bulbous urethra in male, unspecified stricture type - Underwent optilume and has seen improvement in symptoms  - PVR was 4 mL  - IPSS score today was 4 today     Follow-up in 1 year for PVR/IPSS/UA   Tawni Millers as a scribe for Vanna Scotland, MD.,have documented all relevant documentation on the behalf of Vanna Scotland, MD,as directed by  Vanna Scotland, MD while in the presence of Vanna Scotland, MD.  I have reviewed the above documentation for accuracy and completeness, and I agree with the above.   Vanna Scotland, MD   [facility] [address], [address] [place], [place] [address] [phone]

## 2021-02-24 ENCOUNTER — Encounter: Payer: Self-pay | Admitting: Urology

## 2021-02-25 ENCOUNTER — Encounter: Payer: Self-pay | Admitting: Urology

## 2021-02-25 ENCOUNTER — Ambulatory Visit (INDEPENDENT_AMBULATORY_CARE_PROVIDER_SITE_OTHER): Payer: 59 | Admitting: Urology

## 2021-02-25 ENCOUNTER — Other Ambulatory Visit: Payer: Self-pay

## 2021-02-25 VITALS — BP 126/84 | HR 88 | Ht 72.44 in | Wt 262.0 lb

## 2021-02-25 DIAGNOSIS — N35912 Unspecified bulbous urethral stricture, male: Secondary | ICD-10-CM

## 2021-02-25 LAB — BLADDER SCAN AMB NON-IMAGING: Scan Result: 4

## 2021-03-13 ENCOUNTER — Telehealth: Payer: Self-pay

## 2021-03-13 ENCOUNTER — Other Ambulatory Visit: Payer: 59

## 2021-03-13 NOTE — Telephone Encounter (Signed)
Incoming call from a representative of [employer] requesting a peer to peer review with Dr. Apolinar Junes for this patient's long term disability. Advised rep that we are specialty clinic and do not typically complete LTD forms. Also advised rep that there is no documentation that we have initiated this request for the patient.

## 2021-03-20 ENCOUNTER — Ambulatory Visit: Payer: 59 | Admitting: Internal Medicine

## 2021-04-21 ENCOUNTER — Other Ambulatory Visit: Payer: 59

## 2021-04-21 ENCOUNTER — Other Ambulatory Visit: Payer: Self-pay

## 2021-04-21 DIAGNOSIS — E039 Hypothyroidism, unspecified: Secondary | ICD-10-CM

## 2021-04-21 DIAGNOSIS — E785 Hyperlipidemia, unspecified: Secondary | ICD-10-CM

## 2021-04-21 DIAGNOSIS — I10 Essential (primary) hypertension: Secondary | ICD-10-CM

## 2021-04-22 LAB — CMP14+EGFR
ALT: 19 IU/L (ref 0–44)
AST: 23 IU/L (ref 0–40)
Albumin/Globulin Ratio: 2 (ref 1.2–2.2)
Albumin: 4.9 g/dL — ABNORMAL HIGH (ref 3.8–4.8)
Alkaline Phosphatase: 66 IU/L (ref 44–121)
BUN/Creatinine Ratio: 17 (ref 10–24)
BUN: 19 mg/dL (ref 8–27)
Bilirubin Total: 0.5 mg/dL (ref 0.0–1.2)
CO2: 20 mmol/L (ref 20–29)
Calcium: 9.4 mg/dL (ref 8.6–10.2)
Chloride: 103 mmol/L (ref 96–106)
Creatinine, Ser: 1.13 mg/dL (ref 0.76–1.27)
Globulin, Total: 2.4 g/dL (ref 1.5–4.5)
Glucose: 90 mg/dL (ref 70–99)
Potassium: 4.1 mmol/L (ref 3.5–5.2)
Sodium: 142 mmol/L (ref 134–144)
Total Protein: 7.3 g/dL (ref 6.0–8.5)
eGFR: 73 mL/min/{1.73_m2} (ref >59)

## 2021-04-22 LAB — CBC WITH DIFFERENTIAL/PLATELET
Basophils Absolute: 0 10*3/uL (ref 0.0–0.2)
Basos: 0 %
EOS (ABSOLUTE): 0.1 10*3/uL (ref 0.0–0.4)
Eos: 1 %
Hematocrit: 55.4 % — ABNORMAL HIGH (ref 37.5–51.0)
Hemoglobin: 19.5 g/dL — ABNORMAL HIGH (ref 13.0–17.7)
Immature Grans (Abs): 0 10*3/uL (ref 0.0–0.1)
Immature Granulocytes: 0 %
Lymphocytes Absolute: 2 10*3/uL (ref 0.7–3.1)
Lymphs: 19 %
MCH: 32.2 pg (ref 26.6–33.0)
MCHC: 35.2 g/dL (ref 31.5–35.7)
MCV: 92 fL (ref 79–97)
Monocytes Absolute: 0.8 10*3/uL (ref 0.1–0.9)
Monocytes: 8 %
Neutrophils Absolute: 7.1 10*3/uL — ABNORMAL HIGH (ref 1.4–7.0)
Neutrophils: 72 %
Platelets: 171 10*3/uL (ref 150–450)
RBC: 6.05 x10E6/uL — ABNORMAL HIGH (ref 4.14–5.80)
RDW: 13.2 % (ref 11.6–15.4)
WBC: 10 10*3/uL (ref 3.4–10.8)

## 2021-04-22 LAB — LIPID PANEL
Chol/HDL Ratio: 3.8 ratio (ref 0.0–5.0)
Cholesterol, Total: 161 mg/dL (ref 100–199)
HDL: 42 mg/dL (ref >39)
LDL Chol Calc (NIH): 84 mg/dL (ref 0–99)
Triglycerides: 207 mg/dL — ABNORMAL HIGH (ref 0–149)
VLDL Cholesterol Cal: 35 mg/dL (ref 5–40)

## 2021-04-22 LAB — THYROID PANEL WITH TSH
Free Thyroxine Index: 2 (ref 1.2–4.9)
T3 Uptake Ratio: 25 % (ref 24–39)
T4, Total: 7.9 ug/dL (ref 4.5–12.0)
TSH: 14.4 u[IU]/mL — ABNORMAL HIGH (ref 0.450–4.500)

## 2021-04-22 LAB — T4, FREE: Free T4: 1.27 ng/dL (ref 0.82–1.77)

## 2021-04-28 ENCOUNTER — Encounter: Payer: Self-pay | Admitting: Internal Medicine

## 2021-04-28 ENCOUNTER — Other Ambulatory Visit: Payer: Self-pay

## 2021-04-28 ENCOUNTER — Telehealth: Payer: Self-pay | Admitting: Internal Medicine

## 2021-04-28 ENCOUNTER — Ambulatory Visit (INDEPENDENT_AMBULATORY_CARE_PROVIDER_SITE_OTHER): Payer: 59 | Admitting: Internal Medicine

## 2021-04-28 VITALS — BP 118/84 | HR 80 | Temp 98.7°F | Ht 72.0 in | Wt 262.0 lb

## 2021-04-28 DIAGNOSIS — E785 Hyperlipidemia, unspecified: Secondary | ICD-10-CM

## 2021-04-28 DIAGNOSIS — E039 Hypothyroidism, unspecified: Secondary | ICD-10-CM | POA: Diagnosis not present

## 2021-04-28 DIAGNOSIS — I10 Essential (primary) hypertension: Secondary | ICD-10-CM | POA: Diagnosis not present

## 2021-04-28 DIAGNOSIS — Z23 Encounter for immunization: Secondary | ICD-10-CM | POA: Diagnosis not present

## 2021-04-28 MED ORDER — LEVOTHYROXINE SODIUM 25 MCG PO TABS: 25.0000 ug | ORAL_TABLET | Freq: Every day | ORAL | 3 refills | Status: DC

## 2021-04-28 MED ORDER — BUPROPION HCL ER (XL) 150 MG PO TB24: 150.0000 mg | ORAL_TABLET | Freq: Every day | ORAL | 3 refills | Status: DC

## 2021-04-28 NOTE — Telephone Encounter (Signed)
Copied from CRM [id number]. Topic: General - Call Back - No Documentation >> Apr 27, 2021 11:45 AM Randol Kern wrote: Reason for CRM: Pt wants to receive a list of insurance options when he comes tomorrow for his appt. He wants a list that includes insurance plans that are accepted by [facility] and [facility] altogether. Please advise

## 2021-04-28 NOTE — Telephone Encounter (Signed)
Patient was seen in office 04/28/21 pt was given a list of insurance companies that are accepted by [facility]. //ip

## 2021-04-28 NOTE — Progress Notes (Signed)
BP 118/84   Pulse 80   Temp 98.7 F (37.1 C)   Ht 6' (1.829 m)   Wt 262 lb (118.8 kg)   SpO2 98%   BMI 35.53 kg/m    Subjective:    Patient ID: Isaiah Brown, male    DOB: [birth year], 60 y.o.   MRN: [id number]  Chief Complaint  Patient presents with   Follow-up    New medication-Meloxicam 7.5 Daily from [facility] for back pain.    HPI: Isaiah Brown is a 60 y.o. male  Thyroid Problem Presents for follow-up visit. Symptoms include dry skin, hair loss and nail problem. Patient reports no anxiety, cold intolerance, constipation, depressed mood, diaphoresis, diarrhea, hoarse voice, leg swelling or palpitations. The symptoms have been improving.  Hypertension This is a chronic problem. The current episode started more than 1 month ago. The problem is controlled. Pertinent negatives include no anxiety, blurred vision, chest pain, headaches, malaise/fatigue, neck pain, orthopnea, palpitations, peripheral edema or shortness of breath. Identifiable causes of hypertension include a thyroid problem.  Nicotine Dependence Presents for follow-up visit. His urge triggers include company of smokers. The symptoms have been improving. He smokes 1 pack of cigarettes per day.  Back Pain This is a chronic (sees ortho for such, fusion of spine recommended. pt declined such he is on meloxicam for pain and improving sec to such, is taking otc tyelnol) problem. The current episode started more than 1 year ago. The problem occurs intermittently. Pertinent negatives include no chest pain or headaches.   Chief Complaint  Patient presents with   Follow-up    New medication-Meloxicam 7.5 Daily from [facility] for back pain.    Relevant past medical, surgical, family and social history reviewed and updated as indicated. Interim medical history since our last visit reviewed. Allergies and medications reviewed and updated.  Review of Systems  Constitutional:  Negative for diaphoresis and  malaise/fatigue.  HENT:  Negative for hoarse voice.   Eyes:  Negative for blurred vision.  Respiratory:  Negative for shortness of breath.   Cardiovascular:  Negative for chest pain, palpitations and orthopnea.  Gastrointestinal:  Negative for constipation and diarrhea.  Endocrine: Negative for cold intolerance.  Musculoskeletal:  Positive for back pain. Negative for neck pain.  Neurological:  Negative for headaches.  Psychiatric/Behavioral:  The patient is not nervous/anxious.    Per HPI unless specifically indicated above     Objective:    BP 118/84   Pulse 80   Temp 98.7 F (37.1 C)   Ht 6' (1.829 m)   Wt 262 lb (118.8 kg)   SpO2 98%   BMI 35.53 kg/m   Wt Readings from Last 3 Encounters:  04/28/21 262 lb (118.8 kg)  02/25/21 262 lb (118.8 kg)  01/26/21 262 lb 3.2 oz (118.9 kg)    Physical Exam Vitals and nursing note reviewed.  Constitutional:      General: He is not in acute distress.    Appearance: Normal appearance. He is not ill-appearing or diaphoretic.  HENT:     Head: Normocephalic and atraumatic.     Right Ear: Tympanic membrane and external ear normal. There is no impacted cerumen.     Left Ear: External ear normal.     Nose: No congestion or rhinorrhea.     Mouth/Throat:     Pharynx: No oropharyngeal exudate or posterior oropharyngeal erythema.  Eyes:     Conjunctiva/sclera: Conjunctivae normal.     Pupils: Pupils are equal, round, and reactive  to light.  Cardiovascular:     Rate and Rhythm: Normal rate and regular rhythm.     Heart sounds: No murmur heard.   No friction rub. No gallop.  Pulmonary:     Effort: No respiratory distress.     Breath sounds: No stridor. No wheezing or rhonchi.  Chest:     Chest wall: No tenderness.  Abdominal:     General: Abdomen is flat. Bowel sounds are normal.     Palpations: Abdomen is soft. There is no mass.     Tenderness: There is no abdominal tenderness.  Musculoskeletal:     Cervical back: Normal range of  motion and neck supple. No rigidity or tenderness.     Left lower leg: No edema.  Skin:    General: Skin is warm and dry.  Neurological:     Mental Status: He is alert.    Results for orders placed or performed in visit on 04/21/21  T4, free  Result Value Ref Range   Free T4 1.27 0.82 - 1.77 ng/dL  CMP14+EGFR  Result Value Ref Range   Glucose 90 70 - 99 mg/dL   BUN 19 8 - 27 mg/dL   Creatinine, Ser 1.13 0.76 - 1.27 mg/dL   eGFR 73 >59 mL/min/1.73   BUN/Creatinine Ratio 17 10 - 24   Sodium 142 134 - 144 mmol/L   Potassium 4.1 3.5 - 5.2 mmol/L   Chloride 103 96 - 106 mmol/L   CO2 20 20 - 29 mmol/L   Calcium 9.4 8.6 - 10.2 mg/dL   Total Protein 7.3 6.0 - 8.5 g/dL   Albumin 4.9 (H) 3.8 - 4.8 g/dL   Globulin, Total 2.4 1.5 - 4.5 g/dL   Albumin/Globulin Ratio 2.0 1.2 - 2.2   Bilirubin Total 0.5 0.0 - 1.2 mg/dL   Alkaline Phosphatase 66 44 - 121 IU/L   AST 23 0 - 40 IU/L   ALT 19 0 - 44 IU/L  Lipid panel  Result Value Ref Range   Cholesterol, Total 161 100 - 199 mg/dL   Triglycerides 207 (H) 0 - 149 mg/dL   HDL 42 >39 mg/dL   VLDL Cholesterol Cal 35 5 - 40 mg/dL   LDL Chol Calc (NIH) 84 0 - 99 mg/dL   Chol/HDL Ratio 3.8 0.0 - 5.0 ratio  Thyroid Panel With TSH  Result Value Ref Range   TSH 14.400 (H) 0.450 - 4.500 uIU/mL   T4, Total 7.9 4.5 - 12.0 ug/dL   T3 Uptake Ratio 25 24 - 39 %   Free Thyroxine Index 2.0 1.2 - 4.9  CBC with Differential/Platelet  Result Value Ref Range   WBC 10.0 3.4 - 10.8 x10E3/uL   RBC 6.05 (H) 4.14 - 5.80 x10E6/uL   Hemoglobin 19.5 (H) 13.0 - 17.7 g/dL   Hematocrit 55.4 (H) 37.5 - 51.0 %   MCV 92 79 - 97 fL   MCH 32.2 26.6 - 33.0 pg   MCHC 35.2 31.5 - 35.7 g/dL   RDW 13.2 11.6 - 15.4 %   Platelets 171 150 - 450 x10E3/uL   Neutrophils 72 Not Estab. %   Lymphs 19 Not Estab. %   Monocytes 8 Not Estab. %   Eos 1 Not Estab. %   Basos 0 Not Estab. %   Neutrophils Absolute 7.1 (H) 1.4 - 7.0 x10E3/uL   Lymphocytes Absolute 2.0 0.7 - 3.1  x10E3/uL   Monocytes Absolute 0.8 0.1 - 0.9 x10E3/uL   EOS (ABSOLUTE) 0.1 0.0 -  0.4 x10E3/uL   Basophils Absolute 0.0 0.0 - 0.2 x10E3/uL   Immature Granulocytes 0 Not Estab. %   Immature Grans (Abs) 0.0 0.0 - 0.1 x10E3/uL        Current Outpatient Medications:    acetaminophen (TYLENOL) 500 MG tablet, Take 2,000 mg by mouth every 6 (six) hours as needed for moderate pain., Disp: , Rfl:    albuterol (PROVENTIL) (2.5 MG/3ML) 0.083% nebulizer solution, Take 3 mLs (2.5 mg total) by nebulization every 6 (six) hours as needed for wheezing or shortness of breath., Disp: 75 mL, Rfl: 12   albuterol (VENTOLIN HFA) 108 (90 Base) MCG/ACT inhaler, Inhale 2 puffs into the lungs every 6 (six) hours as needed for wheezing or shortness of breath., Disp: 8 g, Rfl: 0   buPROPion (WELLBUTRIN XL) 150 MG 24 hr tablet, Take 1 tablet (150 mg total) by mouth daily., Disp: 30 tablet, Rfl: 3   levothyroxine (SYNTHROID) 25 MCG tablet, Take 1 tablet (25 mcg total) by mouth daily., Disp: 30 tablet, Rfl: 3   levothyroxine (SYNTHROID) 75 MCG tablet, Take 1 tablet (75 mcg total) by mouth daily., Disp: 30 tablet, Rfl: 4   meloxicam (MOBIC) 7.5 MG tablet, Take 7.5 mg by mouth daily., Disp: , Rfl:    Multiple Vitamins-Minerals (MULTIVITAMIN WITH MINERALS) tablet, Take 1 tablet by mouth daily., Disp: , Rfl:    rosuvastatin (CRESTOR) 10 MG tablet, Take 1 tablet (10 mg total) by mouth daily., Disp: 30 tablet, Rfl: 3   Tiotropium Bromide Monohydrate (SPIRIVA RESPIMAT) 2.5 MCG/ACT AERS, Inhale 2 puffs into the lungs daily., Disp: 1 each, Rfl: 6   valsartan-hydrochlorothiazide (DIOVAN-HCT) 80-12.5 MG tablet, Take 1 tablet by mouth daily., Disp: 90 tablet, Rfl: 2    Assessment & Plan:  HLD  LDL better at 80 on crestor 10 mg daily consider   recheck FLP, check LFT's work on diet, SE of meds explained to pt. low fat and high fiber diet explained to pt.  2. Hypothyroidism  Is on synthroid 75 mcg, add 25 mcg more to increase to 100  mcg. Total of 100 mcg for now.  TSH 14  PLEASE TAKE YOUR THYROID MEDICATION FIRST THING IN THE MORNING WHILST FASTING.  NO MEDICATION/ FOOD FOR AN HOUR AFTER INGESTING THYROID PILLS.  3. Back pain is seeing [facility] for such  Is on meloxicam   4. COPD is on spiriva  Is stable, controlled.   5 htn is on valsartan / hctz 80 - 12.5 mg Continue current meds.  Medication compliance emphasised. pt advised to keep Bp logs. Pt verbalised understanding of the same. Pt to have a low salt diet . Exercise to reach a goal of at least 150 mins a week.  lifestyle modifications explained and pt understands importance of the above.'   6. Smoking cessation : will start wellbutrin xl 150 mg daily.  is on 1 ppd was on 3 ppd -- reduced to 1 ppd. Smoking cessation advised.  failed nicotine patches in the past  continues to smoke. more than > 5 - 10 mins of time was spent with pt regarding smoking cessation and complications.  7. Hematuria sec to ? Urethral stricture per urology to fu with them S/p cystoscopy and CT urogram      Problem List Items Addressed This Visit       Cardiovascular and Mediastinum   Primary hypertension     Endocrine   Hypothyroidism - Primary   Relevant Medications   levothyroxine (SYNTHROID) 25 MCG tablet  Other   Hyperlipidemia   Other Visit Diagnoses     Need for immunization against influenza       Relevant Orders   Flu Vaccine QUAD 27moIM (Fluarix, Fluzone & Alfiuria Quad PF) (Completed)        Orders Placed This Encounter  Procedures   Flu Vaccine QUAD 677moM (Fluarix, Fluzone & Alfiuria Quad PF)     Meds ordered this encounter  Medications   levothyroxine (SYNTHROID) 25 MCG tablet    Sig: Take 1 tablet (25 mcg total) by mouth daily.    Dispense:  30 tablet    Refill:  3   buPROPion (WELLBUTRIN XL) 150 MG 24 hr tablet    Sig: Take 1 tablet (150 mg total) by mouth daily.    Dispense:  30 tablet    Refill:  3     Follow up plan: No  follow-ups on file.

## 2021-05-22 ENCOUNTER — Ambulatory Visit: Payer: Self-pay

## 2021-05-22 ENCOUNTER — Other Ambulatory Visit: Payer: Self-pay | Admitting: Internal Medicine

## 2021-05-22 ENCOUNTER — Telehealth: Payer: Self-pay | Admitting: Internal Medicine

## 2021-05-22 MED ORDER — LEVOTHYROXINE SODIUM 100 MCG PO TABS: 100.0000 ug | ORAL_TABLET | Freq: Every day | ORAL | 3 refills | Status: DC

## 2021-05-22 NOTE — Telephone Encounter (Signed)
Pt is scheduled °

## 2021-05-22 NOTE — Telephone Encounter (Signed)
Patient would like PCP to prescribe 100 MCG so he does not have to take 25 MCG and 75 MCG of levothyroxine (SYNTHROID)   Patient has only 7 pills left of levothyroxine (SYNTHROID) 75 MCG tablet.    Patient requesting a refill of rosuvastatin (CRESTOR) 10 MG tablet.  Patient Orthopedic surgeon prescribes meloxicam (MOBIC) 7.5 MG tablet but patient would like his PCP to take take cover prescribing.   Patient will reach out to his pharmacy in the future but would like PCP to send in request today.Informed caller please allow 48 to 72 hour turn around time and to please contact pharmacy in the future in have the request come from pharmacy.          [facility] - [place], [place] - [address] Phone:  [phone]  Fax:  [phone]

## 2021-05-22 NOTE — Telephone Encounter (Signed)
levothyroxine (SYNTHROID) 25 MCG tablet , patient has run out of the levothyroxine (SYNTHROID) 75 MCG tablet and have 7 25 left 100 MCG  Pt. Needs refills. Would like levothyroxine 100 mcg instead of taking 2 different doses. Would also like PCP to refill meloxicam for him so he does not have to go to [facility] "and pay $100 every time I go for a refill." Needs Crestor and Wellbutrin refilled as well. Please let pt. Know when this is done.   Answer Assessment - Initial Assessment Questions 1. DRUG NAME: "What medicine do you need to have refilled?"     Levothyroxine 100 mcg, Crestor, Meloxicam 7.5 mg 2. REFILLS REMAINING: "How many refills are remaining?" (Note: The label on the medicine or pill bottle will show how many refills are remaining. If there are no refills remaining, then a renewal may be needed.)     Has  3. EXPIRATION DATE: "What is the expiration date?" (Note: The label states when the prescription will expire, and thus can no longer be refilled.)     N/a 4. PRESCRIBING HCP: "Who prescribed it?" Reason: If prescribed by specialist, call should be referred to that group.     [facility] has been prescribing the Meloxicam 5. SYMPTOMS: "Do you have any symptoms?"     N/a 6. PREGNANCY: "Is there any chance that you are pregnant?" "When was your last menstrual period?"     N/a  Protocols used: Medication Refill and Renewal Call-A-AH

## 2021-05-22 NOTE — Telephone Encounter (Signed)
See other telephone encounter with request.

## 2021-05-26 ENCOUNTER — Other Ambulatory Visit: Payer: 59

## 2021-05-26 ENCOUNTER — Other Ambulatory Visit: Payer: Self-pay

## 2021-05-26 ENCOUNTER — Other Ambulatory Visit: Payer: Self-pay | Admitting: Internal Medicine

## 2021-05-26 DIAGNOSIS — E039 Hypothyroidism, unspecified: Secondary | ICD-10-CM

## 2021-05-26 NOTE — Telephone Encounter (Signed)
Pt came in to the office for refill approval on Rosuvastatin. Pt has an appointment Tuesday. Pt would need medication flled today.

## 2021-05-26 NOTE — Telephone Encounter (Signed)
Dispense 10 tabs until upcoming appt.  Requested Prescriptions  Pending Prescriptions Disp Refills  . rosuvastatin (CRESTOR) 10 MG tablet [Pharmacy Med Name: ROSUVASTATIN CALCIUM 10 MG TAB] 10 tablet 0    Sig: Take 1 tablet (10 mg total) by mouth daily for 10 days. needs appointment for further refills     Cardiovascular:  Antilipid - Statins Failed - 05/26/2021 11:33 AM      Failed - Triglycerides in normal range and within 360 days    Triglycerides  Date Value Ref Range Status  04/21/2021 207 (H) 0 - 149 mg/dL Final         Passed - Total Cholesterol in normal range and within 360 days    Cholesterol, Total  Date Value Ref Range Status  04/21/2021 161 100 - 199 mg/dL Final         Passed - LDL in normal range and within 360 days    LDL Chol Calc (NIH)  Date Value Ref Range Status  04/21/2021 84 0 - 99 mg/dL Final         Passed - HDL in normal range and within 360 days    HDL  Date Value Ref Range Status  04/21/2021 42 >39 mg/dL Final         Passed - Patient is not pregnant      Passed - Valid encounter within last 12 months    Recent Outpatient Visits          4 weeks ago Hypothyroidism, unspecified type   [facility] Vigg, Avanti, MD   4 months ago Primary hypertension   [facility] Vigg, Avanti, MD   5 months ago Hypothyroidism, unspecified type   [facility] Vigg, Avanti, MD   6 months ago Primary hypertension   [facility] Vigg, Avanti, MD   7 months ago Screening for lung cancer   [facility] Vigg, Avanti, MD      Future Appointments            In 1 week Vigg, Avanti, MD [facility]   In 9 months Vanna Scotland, MD [facility]

## 2021-05-27 LAB — TSH: TSH: 17.7 u[IU]/mL — ABNORMAL HIGH (ref 0.450–4.500)

## 2021-05-27 LAB — T4, FREE: Free T4: 1.12 ng/dL (ref 0.82–1.77)

## 2021-06-02 ENCOUNTER — Other Ambulatory Visit: Payer: Self-pay

## 2021-06-02 ENCOUNTER — Ambulatory Visit (INDEPENDENT_AMBULATORY_CARE_PROVIDER_SITE_OTHER): Payer: 59 | Admitting: Internal Medicine

## 2021-06-02 ENCOUNTER — Encounter: Payer: Self-pay | Admitting: Internal Medicine

## 2021-06-02 VITALS — BP 128/89 | HR 75 | Temp 97.7°F | Ht 72.05 in | Wt 264.6 lb

## 2021-06-02 DIAGNOSIS — Z125 Encounter for screening for malignant neoplasm of prostate: Secondary | ICD-10-CM | POA: Diagnosis not present

## 2021-06-02 DIAGNOSIS — I10 Essential (primary) hypertension: Secondary | ICD-10-CM | POA: Diagnosis not present

## 2021-06-02 DIAGNOSIS — E785 Hyperlipidemia, unspecified: Secondary | ICD-10-CM | POA: Diagnosis not present

## 2021-06-02 DIAGNOSIS — E039 Hypothyroidism, unspecified: Secondary | ICD-10-CM

## 2021-06-02 DIAGNOSIS — M549 Dorsalgia, unspecified: Secondary | ICD-10-CM

## 2021-06-02 MED ORDER — BUPROPION HCL ER (XL) 300 MG PO TB24: 300.0000 mg | ORAL_TABLET | Freq: Every day | ORAL | 4 refills | Status: DC

## 2021-06-02 MED ORDER — ROSUVASTATIN CALCIUM 10 MG PO TABS: 10.0000 mg | ORAL_TABLET | Freq: Every day | ORAL | 2 refills | Status: DC

## 2021-06-02 MED ORDER — LEVOTHYROXINE SODIUM 125 MCG PO TABS: 125.0000 ug | ORAL_TABLET | Freq: Every day | ORAL | 3 refills | Status: DC

## 2021-06-02 MED ORDER — MELOXICAM 7.5 MG PO TABS: 7.5000 mg | ORAL_TABLET | Freq: Every day | ORAL | 2 refills | Status: DC

## 2021-06-02 NOTE — Progress Notes (Signed)
BP 128/89   Pulse 75   Temp 97.7 F (36.5 C) (Oral)   Ht 6' 0.05" (1.83 m)   Wt 264 lb 9.6 oz (120 kg)   SpO2 96%   BMI 35.84 kg/m    Subjective:    Patient ID: Isaiah Brown, male    DOB: [birth year], 60 y.o.   MRN: [id number]  Chief Complaint  Patient presents with  . Hypothyroidism    HPI: Isaiah Brown is a 60 y.o. male  Nicotine Dependence Presents for follow-up visit. Symptoms include fatigue. His urge triggers include company of smokers. The symptoms have been improving. He smokes < 1/2 a pack of cigarettes per day.  Thyroid Problem Presents for follow-up visit. Symptoms include dry skin, fatigue and hair loss. Patient reports no anxiety, cold intolerance, constipation, hoarse voice, leg swelling, nail problem, palpitations, tremors, visual change, weight gain or weight loss. The symptoms have been improving. His past medical history is significant for hyperlipidemia. There is no history of diabetes.  Hyperlipidemia This is a chronic problem. The problem is controlled. He has no history of chronic renal disease, diabetes or hypothyroidism.   Chief Complaint  Patient presents with  . Hypothyroidism    Relevant past medical, surgical, family and social history reviewed and updated as indicated. Interim medical history since our last visit reviewed. Allergies and medications reviewed and updated.  Review of Systems  Constitutional:  Positive for fatigue. Negative for weight gain and weight loss.  HENT:  Negative for hoarse voice.   Cardiovascular:  Negative for palpitations.  Gastrointestinal:  Negative for constipation.  Endocrine: Negative for cold intolerance.  Neurological:  Negative for tremors.  Psychiatric/Behavioral:  The patient is not nervous/anxious.    Per HPI unless specifically indicated above     Objective:    BP 128/89   Pulse 75   Temp 97.7 F (36.5 C) (Oral)   Ht 6' 0.05" (1.83 m)   Wt 264 lb 9.6 oz (120 kg)   SpO2 96%   BMI 35.84  kg/m   Wt Readings from Last 3 Encounters:  06/02/21 264 lb 9.6 oz (120 kg)  04/28/21 262 lb (118.8 kg)  02/25/21 262 lb (118.8 kg)    Physical Exam Vitals and nursing note reviewed.  Constitutional:      General: He is not in acute distress.    Appearance: Normal appearance. He is not ill-appearing or diaphoretic.  HENT:     Head: Normocephalic and atraumatic.     Right Ear: Tympanic membrane and external ear normal. There is no impacted cerumen.     Left Ear: External ear normal.     Nose: No congestion or rhinorrhea.     Mouth/Throat:     Pharynx: No oropharyngeal exudate or posterior oropharyngeal erythema.  Eyes:     Conjunctiva/sclera: Conjunctivae normal.     Pupils: Pupils are equal, round, and reactive to light.  Cardiovascular:     Rate and Rhythm: Normal rate and regular rhythm.     Heart sounds: No murmur heard.   No friction rub. No gallop.  Pulmonary:     Effort: No respiratory distress.     Breath sounds: No stridor. No wheezing or rhonchi.  Chest:     Chest wall: No tenderness.  Abdominal:     General: Abdomen is flat. Bowel sounds are normal.     Palpations: Abdomen is soft. There is no mass.     Tenderness: There is no abdominal tenderness.  Musculoskeletal:  Cervical back: Normal range of motion and neck supple. No rigidity or tenderness.     Left lower leg: No edema.  Skin:    General: Skin is warm and dry.  Neurological:     Mental Status: He is alert.    Results for orders placed or performed in visit on 05/26/21  T4, free  Result Value Ref Range   Free T4 1.12 0.82 - 1.77 ng/dL  TSH  Result Value Ref Range   TSH 17.700 (H) 0.450 - 4.500 uIU/mL        Current Outpatient Medications:  .  acetaminophen (TYLENOL) 500 MG tablet, Take 2,000 mg by mouth every 6 (six) hours as needed for moderate pain., Disp: , Rfl:  .  albuterol (PROVENTIL) (2.5 MG/3ML) 0.083% nebulizer solution, Take 3 mLs (2.5 mg total) by nebulization every 6 (six) hours  as needed for wheezing or shortness of breath., Disp: 75 mL, Rfl: 12 .  albuterol (VENTOLIN HFA) 108 (90 Base) MCG/ACT inhaler, Inhale 2 puffs into the lungs every 6 (six) hours as needed for wheezing or shortness of breath., Disp: 8 g, Rfl: 0 .  buPROPion (WELLBUTRIN XL) 150 MG 24 hr tablet, Take 1 tablet (150 mg total) by mouth daily., Disp: 30 tablet, Rfl: 3 .  levothyroxine (SYNTHROID) 100 MCG tablet, Take 1 tablet (100 mcg total) by mouth daily., Disp: 30 tablet, Rfl: 3 .  meloxicam (MOBIC) 7.5 MG tablet, Take 7.5 mg by mouth daily., Disp: , Rfl:  .  Multiple Vitamins-Minerals (MULTIVITAMIN WITH MINERALS) tablet, Take 1 tablet by mouth daily., Disp: , Rfl:  .  rosuvastatin (CRESTOR) 10 MG tablet, Take 1 tablet (10 mg total) by mouth daily for 10 days. needs appointment for further refills, Disp: 10 tablet, Rfl: 0 .  Tiotropium Bromide Monohydrate (SPIRIVA RESPIMAT) 2.5 MCG/ACT AERS, Inhale 2 puffs into the lungs daily., Disp: 1 each, Rfl: 6 .  valsartan-hydrochlorothiazide (DIOVAN-HCT) 80-12.5 MG tablet, Take 1 tablet by mouth daily., Disp: 90 tablet, Rfl: 2 .  meloxicam (MOBIC) 7.5 MG tablet, meloxicam 7.5 mg tablet, Disp: , Rfl:     Assessment & Plan:  Back pain to take meloxicam only as needed for such  refills sent in x 3 months.   2. Hypothyrodism : TSH elevated still is on 125 mcg of such has noticed lesser hair fall out, some dry skin stilll persistent.   3 HLD on crestor  recheck FLP, check LFT's work on diet, SE of meds explained to pt. low fat and high fiber diet explained to pt.  4. Smoking cessation is on welbutrin xl 150 mg daily  Works for half  day per pt then cravings start increase to 300 mg daily.   Problem List Items Addressed This Visit   None    Orders Placed This Encounter  Procedures  . CBC with Differential/Platelet  . Comprehensive metabolic panel  . PSA  . TSH  . Urinalysis, Routine w reflex microscopic     Meds ordered this encounter  Medications   . rosuvastatin (CRESTOR) 10 MG tablet    Sig: Take 1 tablet (10 mg total) by mouth daily for 10 days. needs appointment for further refills    Dispense:  30 tablet    Refill:  2  . meloxicam (MOBIC) 7.5 MG tablet    Sig: Take 1 tablet (7.5 mg total) by mouth daily.    Dispense:  30 tablet    Refill:  2  . levothyroxine (SYNTHROID) 125 MCG tablet  Sig: Take 1 tablet (125 mcg total) by mouth daily.    Dispense:  30 tablet    Refill:  3  . buPROPion (WELLBUTRIN XL) 300 MG 24 hr tablet    Sig: Take 1 tablet (300 mg total) by mouth daily.    Dispense:  30 tablet    Refill:  4     Follow up plan: No follow-ups on file. Labs next visit : CBC, CMP, FLP, HBA1C, TSH, PSA, urine microalbumin Labs 1 week prior to next visit.

## 2021-08-04 ENCOUNTER — Other Ambulatory Visit: Payer: Self-pay

## 2021-08-04 ENCOUNTER — Other Ambulatory Visit (INDEPENDENT_AMBULATORY_CARE_PROVIDER_SITE_OTHER): Payer: 59

## 2021-08-04 DIAGNOSIS — E039 Hypothyroidism, unspecified: Secondary | ICD-10-CM

## 2021-08-04 DIAGNOSIS — Z125 Encounter for screening for malignant neoplasm of prostate: Secondary | ICD-10-CM | POA: Diagnosis not present

## 2021-08-04 DIAGNOSIS — I10 Essential (primary) hypertension: Secondary | ICD-10-CM

## 2021-08-04 DIAGNOSIS — E785 Hyperlipidemia, unspecified: Secondary | ICD-10-CM

## 2021-08-05 LAB — COMPREHENSIVE METABOLIC PANEL
ALT: 14 IU/L (ref 0–44)
AST: 19 IU/L (ref 0–40)
Albumin/Globulin Ratio: 1.7 (ref 1.2–2.2)
Albumin: 4.5 g/dL (ref 3.8–4.8)
Alkaline Phosphatase: 60 IU/L (ref 44–121)
BUN/Creatinine Ratio: 12 (ref 10–24)
BUN: 14 mg/dL (ref 8–27)
Bilirubin Total: 0.4 mg/dL (ref 0.0–1.2)
CO2: 19 mmol/L — ABNORMAL LOW (ref 20–29)
Calcium: 9.5 mg/dL (ref 8.6–10.2)
Chloride: 102 mmol/L (ref 96–106)
Creatinine, Ser: 1.2 mg/dL (ref 0.76–1.27)
Globulin, Total: 2.6 g/dL (ref 1.5–4.5)
Glucose: 85 mg/dL (ref 70–99)
Potassium: 4.2 mmol/L (ref 3.5–5.2)
Sodium: 140 mmol/L (ref 134–144)
Total Protein: 7.1 g/dL (ref 6.0–8.5)
eGFR: 68 mL/min/{1.73_m2} (ref >59)

## 2021-08-05 LAB — CBC WITH DIFFERENTIAL/PLATELET
Basophils Absolute: 0.1 10*3/uL (ref 0.0–0.2)
Basos: 1 %
EOS (ABSOLUTE): 0.2 10*3/uL (ref 0.0–0.4)
Eos: 2 %
Hematocrit: 51.1 % — ABNORMAL HIGH (ref 37.5–51.0)
Hemoglobin: 18 g/dL — ABNORMAL HIGH (ref 13.0–17.7)
Immature Grans (Abs): 0 10*3/uL (ref 0.0–0.1)
Immature Granulocytes: 1 %
Lymphocytes Absolute: 2.5 10*3/uL (ref 0.7–3.1)
Lymphs: 29 %
MCH: 32.8 pg (ref 26.6–33.0)
MCHC: 35.2 g/dL (ref 31.5–35.7)
MCV: 93 fL (ref 79–97)
Monocytes Absolute: 0.7 10*3/uL (ref 0.1–0.9)
Monocytes: 8 %
Neutrophils Absolute: 5 10*3/uL (ref 1.4–7.0)
Neutrophils: 59 %
Platelets: 212 10*3/uL (ref 150–450)
RBC: 5.48 x10E6/uL (ref 4.14–5.80)
RDW: 12.5 % (ref 11.6–15.4)
WBC: 8.4 10*3/uL (ref 3.4–10.8)

## 2021-08-05 LAB — LIPID PANEL
Chol/HDL Ratio: 3.3 ratio (ref 0.0–5.0)
Cholesterol, Total: 150 mg/dL (ref 100–199)
HDL: 46 mg/dL (ref >39)
LDL Chol Calc (NIH): 75 mg/dL (ref 0–99)
Triglycerides: 168 mg/dL — ABNORMAL HIGH (ref 0–149)
VLDL Cholesterol Cal: 29 mg/dL (ref 5–40)

## 2021-08-05 LAB — TSH: TSH: 7.19 u[IU]/mL — ABNORMAL HIGH (ref 0.450–4.500)

## 2021-08-05 LAB — PSA: Prostate Specific Ag, Serum: 0.7 ng/mL (ref 0.0–4.0)

## 2021-08-05 MED ORDER — LEVOTHYROXINE SODIUM 150 MCG PO TABS: 150.0000 ug | ORAL_TABLET | Freq: Every day | ORAL | 1 refills | Status: DC

## 2021-08-05 NOTE — Progress Notes (Signed)
NEEDS to increase synthroid to 150 mcg pl let him know thnx.

## 2021-08-11 ENCOUNTER — Encounter: Payer: Self-pay | Admitting: Internal Medicine

## 2021-08-11 ENCOUNTER — Ambulatory Visit: Payer: 59 | Admitting: Internal Medicine

## 2021-08-11 ENCOUNTER — Other Ambulatory Visit: Payer: Self-pay

## 2021-08-11 VITALS — BP 122/84 | HR 90 | Temp 97.6°F | Ht 72.05 in | Wt 269.6 lb

## 2021-08-11 DIAGNOSIS — R69 Illness, unspecified: Secondary | ICD-10-CM | POA: Diagnosis not present

## 2021-08-11 DIAGNOSIS — E039 Hypothyroidism, unspecified: Secondary | ICD-10-CM

## 2021-08-11 DIAGNOSIS — I10 Essential (primary) hypertension: Secondary | ICD-10-CM | POA: Diagnosis not present

## 2021-08-11 DIAGNOSIS — E785 Hyperlipidemia, unspecified: Secondary | ICD-10-CM | POA: Diagnosis not present

## 2021-08-11 DIAGNOSIS — E669 Obesity, unspecified: Secondary | ICD-10-CM

## 2021-08-11 DIAGNOSIS — F17200 Nicotine dependence, unspecified, uncomplicated: Secondary | ICD-10-CM

## 2021-08-11 MED ORDER — MELOXICAM 7.5 MG PO TABS: 7.5000 mg | ORAL_TABLET | Freq: Every day | ORAL | 2 refills | Status: DC

## 2021-08-11 NOTE — Progress Notes (Signed)
BP 122/84    Pulse 90    Temp 97.6 F (36.4 C) (Oral)    Ht 6' 0.05" (1.83 m)    Wt 269 lb 9.6 oz (122.3 kg)    SpO2 97%    BMI 36.52 kg/m    Subjective:    Patient ID: Isaiah Brown, male    DOB: December 16, 1960, 61 y.o.   MRN: [id number]  Chief Complaint  Patient presents with   Hypertension   Hyperlipidemia   Hypothyroidism   Medication Consultation    Wants to talk about Gabapentin    HPI: Isaiah Brown is a 61 y.o. male  Tobacco abuse - was on Wellbutrin which made him feel blah and not want to do anything   stopped taking this. Down to about 1 ppd  -down from 21/2 - 3 ppd    Hypertension This is a chronic problem. The current episode started more than 1 year ago. The problem has been waxing and waning since onset. The problem is controlled. Pertinent negatives include no anxiety, chest pain, headaches, malaise/fatigue, neck pain, orthopnea, palpitations, peripheral edema, PND, shortness of breath or sweats. Identifiable causes of hypertension include a thyroid problem.  Hyperlipidemia Pertinent negatives include no chest pain, myalgias or shortness of breath.  Thyroid Problem Presents for follow-up visit. Patient reports no anxiety, cold intolerance, constipation, depressed mood, diaphoresis, diarrhea, fatigue, heat intolerance, hoarse voice, leg swelling, menstrual problem, nail problem, palpitations or tremors. His past medical history is significant for hyperlipidemia.   Chief Complaint  Patient presents with   Hypertension   Hyperlipidemia   Hypothyroidism   Medication Consultation    Wants to talk about Gabapentin    Relevant past medical, surgical, family and social history reviewed and updated as indicated. Interim medical history since our last visit reviewed. Allergies and medications reviewed and updated.  Review of Systems  Constitutional:  Negative for activity change, appetite change, chills, diaphoresis, fatigue, fever and malaise/fatigue.  HENT:   Negative for congestion, ear discharge, ear pain, facial swelling and hoarse voice.   Eyes:  Negative for pain, discharge and itching.  Respiratory:  Negative for cough, chest tightness, shortness of breath and wheezing.   Cardiovascular:  Negative for chest pain, palpitations, orthopnea, leg swelling and PND.  Gastrointestinal:  Negative for abdominal distention, abdominal pain, blood in stool, constipation, diarrhea, nausea and vomiting.  Endocrine: Negative for cold intolerance, heat intolerance, polydipsia, polyphagia and polyuria.  Genitourinary:  Negative for difficulty urinating, dysuria, flank pain, frequency, hematuria, menstrual problem and urgency.  Musculoskeletal:  Negative for arthralgias, gait problem, joint swelling, myalgias and neck pain.  Skin:  Negative for color change, rash and wound.  Neurological:  Negative for dizziness, tremors, speech difficulty, weakness, light-headedness, numbness and headaches.  Hematological:  Does not bruise/bleed easily.  Psychiatric/Behavioral:  Negative for agitation, confusion, decreased concentration, sleep disturbance and suicidal ideas. The patient is not nervous/anxious.    Per HPI unless specifically indicated above     Objective:    BP 122/84    Pulse 90    Temp 97.6 F (36.4 C) (Oral)    Ht 6' 0.05" (1.83 m)    Wt 269 lb 9.6 oz (122.3 kg)    SpO2 97%    BMI 36.52 kg/m   Wt Readings from Last 3 Encounters:  08/11/21 269 lb 9.6 oz (122.3 kg)  06/02/21 264 lb 9.6 oz (120 kg)  04/28/21 262 lb (118.8 kg)    Physical Exam Vitals and nursing note reviewed.  Constitutional:  General: He is not in acute distress.    Appearance: Normal appearance. He is not ill-appearing or diaphoretic.  HENT:     Head: Normocephalic and atraumatic.     Right Ear: Tympanic membrane and external ear normal. There is no impacted cerumen.     Left Ear: External ear normal.     Nose: No congestion or rhinorrhea.     Mouth/Throat:     Pharynx: No  oropharyngeal exudate or posterior oropharyngeal erythema.  Eyes:     Conjunctiva/sclera: Conjunctivae normal.     Pupils: Pupils are equal, round, and reactive to light.  Cardiovascular:     Rate and Rhythm: Normal rate and regular rhythm.     Heart sounds: No murmur heard.   No friction rub. No gallop.  Pulmonary:     Effort: No respiratory distress.     Breath sounds: No stridor. No wheezing or rhonchi.  Chest:     Chest wall: No tenderness.  Abdominal:     General: Abdomen is flat. Bowel sounds are normal.     Palpations: Abdomen is soft. There is no mass.     Tenderness: There is no abdominal tenderness.  Musculoskeletal:     Cervical back: Normal range of motion and neck supple. No rigidity or tenderness.     Left lower leg: No edema.  Skin:    General: Skin is warm and dry.  Neurological:     General: No focal deficit present.     Mental Status: He is alert and oriented to person, place, and time.     Cranial Nerves: No cranial nerve deficit.     Sensory: No sensory deficit.     Motor: No weakness.     Coordination: Coordination normal.     Gait: Gait normal.     Deep Tendon Reflexes: Reflexes normal.  Psychiatric:        Mood and Affect: Mood normal.        Behavior: Behavior normal.        Thought Content: Thought content normal.        Judgment: Judgment normal.    Results for orders placed or performed in visit on 08/04/21  Comp Met (CMET)  Result Value Ref Range   Glucose 85 70 - 99 mg/dL   BUN 14 8 - 27 mg/dL   Creatinine, Ser 1.20 0.76 - 1.27 mg/dL   eGFR 68 >59 mL/min/1.73   BUN/Creatinine Ratio 12 10 - 24   Sodium 140 134 - 144 mmol/L   Potassium 4.2 3.5 - 5.2 mmol/L   Chloride 102 96 - 106 mmol/L   CO2 19 (L) 20 - 29 mmol/L   Calcium 9.5 8.6 - 10.2 mg/dL   Total Protein 7.1 6.0 - 8.5 g/dL   Albumin 4.5 3.8 - 4.8 g/dL   Globulin, Total 2.6 1.5 - 4.5 g/dL   Albumin/Globulin Ratio 1.7 1.2 - 2.2   Bilirubin Total 0.4 0.0 - 1.2 mg/dL   Alkaline  Phosphatase 60 44 - 121 IU/L   AST 19 0 - 40 IU/L   ALT 14 0 - 44 IU/L  PSA  Result Value Ref Range   Prostate Specific Ag, Serum 0.7 0.0 - 4.0 ng/mL  CBC with Differential/Platelet  Result Value Ref Range   WBC 8.4 3.4 - 10.8 x10E3/uL   RBC 5.48 4.14 - 5.80 x10E6/uL   Hemoglobin 18.0 (H) 13.0 - 17.7 g/dL   Hematocrit 51.1 (H) 37.5 - 51.0 %   MCV 93 79 -  97 fL   MCH 32.8 26.6 - 33.0 pg   MCHC 35.2 31.5 - 35.7 g/dL   RDW 12.5 11.6 - 15.4 %   Platelets 212 150 - 450 x10E3/uL   Neutrophils 59 Not Estab. %   Lymphs 29 Not Estab. %   Monocytes 8 Not Estab. %   Eos 2 Not Estab. %   Basos 1 Not Estab. %   Neutrophils Absolute 5.0 1.4 - 7.0 x10E3/uL   Lymphocytes Absolute 2.5 0.7 - 3.1 x10E3/uL   Monocytes Absolute 0.7 0.1 - 0.9 x10E3/uL   EOS (ABSOLUTE) 0.2 0.0 - 0.4 x10E3/uL   Basophils Absolute 0.1 0.0 - 0.2 x10E3/uL   Immature Granulocytes 1 Not Estab. %   Immature Grans (Abs) 0.0 0.0 - 0.1 x10E3/uL  Lipid panel  Result Value Ref Range   Cholesterol, Total 150 100 - 199 mg/dL   Triglycerides 168 (H) 0 - 149 mg/dL   HDL 46 >39 mg/dL   VLDL Cholesterol Cal 29 5 - 40 mg/dL   LDL Chol Calc (NIH) 75 0 - 99 mg/dL   Chol/HDL Ratio 3.3 0.0 - 5.0 ratio  TSH  Result Value Ref Range   TSH 7.190 (H) 0.450 - 4.500 uIU/mL        Current Outpatient Medications:    acetaminophen (TYLENOL) 500 MG tablet, Take 2,000 mg by mouth every 6 (six) hours as needed for moderate pain., Disp: , Rfl:    albuterol (PROVENTIL) (2.5 MG/3ML) 0.083% nebulizer solution, Take 3 mLs (2.5 mg total) by nebulization every 6 (six) hours as needed for wheezing or shortness of breath., Disp: 75 mL, Rfl: 12   albuterol (VENTOLIN HFA) 108 (90 Base) MCG/ACT inhaler, Inhale 2 puffs into the lungs every 6 (six) hours as needed for wheezing or shortness of breath., Disp: 8 g, Rfl: 0   buPROPion (WELLBUTRIN XL) 300 MG 24 hr tablet, Take 1 tablet (300 mg total) by mouth daily., Disp: 30 tablet, Rfl: 4   levothyroxine  (SYNTHROID) 150 MCG tablet, Take 1 tablet (150 mcg total) by mouth daily., Disp: 30 tablet, Rfl: 1   Multiple Vitamins-Minerals (MULTIVITAMIN WITH MINERALS) tablet, Take 1 tablet by mouth daily., Disp: , Rfl:    Tiotropium Bromide Monohydrate (SPIRIVA RESPIMAT) 2.5 MCG/ACT AERS, Inhale 2 puffs into the lungs daily., Disp: 1 each, Rfl: 6   valsartan-hydrochlorothiazide (DIOVAN-HCT) 80-12.5 MG tablet, Take 1 tablet by mouth daily., Disp: 90 tablet, Rfl: 2   meloxicam (MOBIC) 7.5 MG tablet, Take 1 tablet (7.5 mg total) by mouth daily. (Patient not taking: Reported on 08/11/2021), Disp: 30 tablet, Rfl: 2   rosuvastatin (CRESTOR) 10 MG tablet, Take 1 tablet (10 mg total) by mouth daily for 10 days. needs appointment for further refills, Disp: 30 tablet, Rfl: 2    Assessment & Plan:  Hypothyrodism : Chronic, ongoing with recent TSH within normal range.  Continue current medication regimen and adjust as needed.  Plan to recheck TSH at physical in 6 months.   Latest Reference Range & Units 05/26/21 11:12 08/04/21 11:43  TSH 0.450 - 4.500 uIU/mL 17.700 (H) 7.190 (H)  T4,Free(Direct) 0.82 - 1.77 ng/dL 1.12   (H): Data is abnormally high   2. Smoking cessation  Smoking cessation advised. pt refuses chantix. failed nicotine patches in the past. continues to smoke. more than > 5 - 10 mins of time was spent with pt regarding smoking cessation and complications.    3. HLD : is on crestor 10 mg.  recheck FLP, check LFT's work  on diet, SE of meds explained to pt. low fat and high fiber diet explained to pt.   4. HTN : is on valsartan / hctz HTN :  Continue current meds.  Medication compliance emphasised. pt advised to keep Bp logs. Pt verbalised understanding of the same. Pt to have a low salt diet . Exercise to reach a goal of at least 150 mins a week.  lifestyle modifications explained and pt understands importance of the above. Under good control on current regimen. Continue current regimen.  Continue to monitor. Call with any concerns. Refills given. Labs drawn today.   5. Copd is on albuterol , spiriva 2 puffs dailyl isnt compliant with such  6. Back pain - is on meloxicam for this. Friend of his is on Neurontin. Ho etoh abuse doenst want to take anyting that can be addictive. Use meloxicam as needed only if pain level is a 9 - 10   7. Obestiy BMI - 36  Lifestyle modifications advised to pt.  Portion control and avoiding high carb low fat diet advised.  Diet plan given to pt   exercise plan given and encouraged.  To increase exercise to 150 mins a week ie 21/2 hours a week. Pt verbalises understanding of the above.

## 2021-08-11 NOTE — Patient Instructions (Signed)
Calorie Counting for Weight Loss °Calories are units of energy. Your body needs a certain number of calories from food to keep going throughout the day. When you eat or drink more calories than your body needs, your body stores the extra calories mostly as fat. When you eat or drink fewer calories than your body needs, your body burns fat to get the energy it needs. °Calorie counting means keeping track of how many calories you eat and drink each day. Calorie counting can be helpful if you need to lose weight. If you eat fewer calories than your body needs, you should lose weight. Ask your health care provider what a healthy weight is for you. °For calorie counting to work, you will need to eat the right number of calories each day to lose a healthy amount of weight per week. A dietitian can help you figure out how many calories you need in a day and will suggest ways to reach your calorie goal. °A healthy amount of weight to lose each week is usually 1-2 lb (0.5-0.9 kg). This usually means that your daily calorie intake should be reduced by 500-750 calories. °Eating 1,200-1,500 calories a day can help most women lose weight. °Eating 1,500-1,800 calories a day can help most men lose weight. °What do I need to know about calorie counting? °Work with your health care provider or dietitian to determine how many calories you should get each day. To meet your daily calorie goal, you will need to: °Find out how many calories are in each food that you would like to eat. Try to do this before you eat. °Decide how much of the food you plan to eat. °Keep a food log. Do this by writing down what you ate and how many calories it had. °To successfully lose weight, it is important to balance calorie counting with a healthy lifestyle that includes regular activity. °Where do I find calorie information? °The number of calories in a food can be found on a Nutrition Facts label. If a food does not have a Nutrition Facts label, try to  look up the calories online or ask your dietitian for help. °Remember that calories are listed per serving. If you choose to have more than one serving of a food, you will have to multiply the calories per serving by the number of servings you plan to eat. For example, the label on a package of bread might say that a serving size is 1 slice and that there are 90 calories in a serving. If you eat 1 slice, you will have eaten 90 calories. If you eat 2 slices, you will have eaten 180 calories. °How do I keep a food log? °After each time that you eat, record the following in your food log as soon as possible: °What you ate. Be sure to include toppings, sauces, and other extras on the food. °How much you ate. This can be measured in cups, ounces, or number of items. °How many calories were in each food and drink. °The total number of calories in the food you ate. °Keep your food log near you, such as in a pocket-sized notebook or on an app or website on your mobile phone. Some programs will calculate calories for you and show you how many calories you have left to meet your daily goal. °What are some portion-control tips? °Know how many calories are in a serving. This will help you know how many servings you can have of a certain food. °  Use a measuring cup to measure serving sizes. You could also try weighing out portions on a kitchen scale. With time, you will be able to estimate serving sizes for some foods. °Take time to put servings of different foods on your favorite plates or in your favorite bowls and cups so you know what a serving looks like. °Try not to eat straight from a food's packaging, such as from a bag or box. Eating straight from the package makes it hard to see how much you are eating and can lead to overeating. Put the amount you would like to eat in a cup or on a plate to make sure you are eating the right portion. °Use smaller plates, glasses, and bowls for smaller portions and to prevent  overeating. °Try not to multitask. For example, avoid watching TV or using your computer while eating. If it is time to eat, sit down at a table and enjoy your food. This will help you recognize when you are full. It will also help you be more mindful of what and how much you are eating. °What are tips for following this plan? °Reading food labels °Check the calorie count compared with the serving size. The serving size may be smaller than what you are used to eating. °Check the source of the calories. Try to choose foods that are high in protein, fiber, and vitamins, and low in saturated fat, trans fat, and sodium. °Shopping °Read nutrition labels while you shop. This will help you make healthy decisions about which foods to buy. °Pay attention to nutrition labels for low-fat or fat-free foods. These foods sometimes have the same number of calories or more calories than the full-fat versions. They also often have added sugar, starch, or salt to make up for flavor that was removed with the fat. °Make a grocery list of lower-calorie foods and stick to it. °Cooking °Try to cook your favorite foods in a healthier way. For example, try baking instead of frying. °Use low-fat dairy products. °Meal planning °Use more fruits and vegetables. One-half of your plate should be fruits and vegetables. °Include lean proteins, such as chicken, turkey, and fish. °Lifestyle °Each week, aim to do one of the following: °150 minutes of moderate exercise, such as walking. °75 minutes of vigorous exercise, such as running. °General information °Know how many calories are in the foods you eat most often. This will help you calculate calorie counts faster. °Find a way of tracking calories that works for you. Get creative. Try different apps or programs if writing down calories does not work for you. °What foods should I eat? ° °Eat nutritious foods. It is better to have a nutritious, high-calorie food, such as an avocado, than a food with  few nutrients, such as a bag of potato chips. °Use your calories on foods and drinks that will fill you up and will not leave you hungry soon after eating. °Examples of foods that fill you up are nuts and nut butters, vegetables, lean proteins, and high-fiber foods such as whole grains. High-fiber foods are foods with more than 5 g of fiber per serving. °Pay attention to calories in drinks. Low-calorie drinks include water and unsweetened drinks. °The items listed above may not be a complete list of foods and beverages you can eat. Contact a dietitian for more information. °What foods should I limit? °Limit foods or drinks that are not good sources of vitamins, minerals, or protein or that are high in unhealthy fats. These include: °  Candy. °Other sweets. °Sodas, specialty coffee drinks, alcohol, and juice. °The items listed above may not be a complete list of foods and beverages you should avoid. Contact a dietitian for more information. °How do I count calories when eating out? °Pay attention to portions. Often, portions are much larger when eating out. Try these tips to keep portions smaller: °Consider sharing a meal instead of getting your own. °If you get your own meal, eat only half of it. Before you start eating, ask for a container and put half of your meal into it. °When available, consider ordering smaller portions from the menu instead of full portions. °Pay attention to your food and drink choices. Knowing the way food is cooked and what is included with the meal can help you eat fewer calories. °If calories are listed on the menu, choose the lower-calorie options. °Choose dishes that include vegetables, fruits, whole grains, low-fat dairy products, and lean proteins. °Choose items that are boiled, broiled, grilled, or steamed. Avoid items that are buttered, battered, fried, or served with cream sauce. Items labeled as crispy are usually fried, unless stated otherwise. °Choose water, low-fat milk,  unsweetened iced tea, or other drinks without added sugar. If you want an alcoholic beverage, choose a lower-calorie option, such as a glass of wine or light beer. °Ask for dressings, sauces, and syrups on the side. These are usually high in calories, so you should limit the amount you eat. °If you want a salad, choose a garden salad and ask for grilled meats. Avoid extra toppings such as bacon, cheese, or fried items. Ask for the dressing on the side, or ask for olive oil and vinegar or lemon to use as dressing. °Estimate how many servings of a food you are given. Knowing serving sizes will help you be aware of how much food you are eating at restaurants. °Where to find more information °Centers for Disease Control and Prevention: www.cdc.gov °[place] Department of Agriculture: myplate.gov °Summary °Calorie counting means keeping track of how many calories you eat and drink each day. If you eat fewer calories than your body needs, you should lose weight. °A healthy amount of weight to lose per week is usually 1-2 lb (0.5-0.9 kg). This usually means reducing your daily calorie intake by 500-750 calories. °The number of calories in a food can be found on a Nutrition Facts label. If a food does not have a Nutrition Facts label, try to look up the calories online or ask your dietitian for help. °Use smaller plates, glasses, and bowls for smaller portions and to prevent overeating. °Use your calories on foods and drinks that will fill you up and not leave you hungry shortly after a meal. °This information is not intended to replace advice given to you by your health care provider. Make sure you discuss any questions you have with your health care provider. °Document Revised: 08/02/2019 Document Reviewed: 08/02/2019 °Elsevier Patient Education © 2022 [employer] °BMI for Adults °What is BMI? °Body mass index (BMI) is a number that is calculated from a person's weight and height. BMI can help estimate how much of a  person's weight is composed of fat. BMI does not measure body fat directly. Rather, it is an alternative to procedures that directly measure body fat, which can be difficult and expensive. °BMI can help identify people who may be at higher risk for certain medical problems. °What are BMI measurements used for? °BMI is used as a screening tool to identify possible weight problems.   It helps determine whether a person is obese, overweight, a healthy weight, or underweight. °BMI is useful for: °Identifying a weight problem that may be related to a medical condition or may increase the risk for medical problems. °Promoting changes, such as changes in diet and exercise, to help reach a healthy weight. BMI screening can be repeated to see if these changes are working. °How is BMI calculated? °BMI involves measuring your weight in relation to your height. Both height and weight are measured, and the BMI is calculated from those numbers. This can be done either in English ([place] or metric measurements. Note that charts and online BMI calculators are available to help you find your BMI quickly and easily without having to do these calculations yourself. °To calculate your BMI in English ([place] measurements: ° °Measure your weight in pounds (lb). °Multiply the number of pounds by 703. °For example, for a person who weighs 180 lb, multiply that number by 703, which equals 126,540. °Measure your height in inches. Then multiply that number by itself to get a measurement called "inches squared." °For example, for a person who is 70 inches tall, the "inches squared" measurement is 70 inches x 70 inches, which equals 4,900 inches squared. °Divide the total from step 2 (number of lb x 703) by the total from step 3 (inches squared): 126,540 ÷ 4,900 = 25.8. This is your BMI. °To calculate your BMI in metric measurements: °Measure your weight in kilograms (kg). °Measure your height in meters (m). Then multiply that number by itself to  get a measurement called "meters squared." °For example, for a person who is 1.75 m tall, the "meters squared" measurement is 1.75 m x 1.75 m, which is equal to 3.1 meters squared. °Divide the number of kilograms (your weight) by the meters squared number. In this example: 70 ÷ 3.1 = 22.6. This is your BMI. °What do the results mean? °BMI charts are used to identify whether you are underweight, normal weight, overweight, or obese. The following guidelines will be used: °Underweight: BMI less than 18.5. °Normal weight: BMI between 18.5 and 24.9. °Overweight: BMI between 25 and 29.9. °Obese: BMI of 30 or above. °Keep these notes in mind: °Weight includes both fat and muscle, so someone with a muscular build, such as an athlete, may have a BMI that is higher than 24.9. In cases like these, BMI is not an accurate measure of body fat. °To determine if excess body fat is the cause of a BMI of 25 or higher, further assessments may need to be done by a health care provider. °BMI is usually interpreted in the same way for men and women. °Where to find more information °For more information about BMI, including tools to quickly calculate your BMI, go to these websites: °Centers for Disease Control and Prevention: www.cdc.gov °American Heart Association: www.heart.org °National Heart, Lung, and Blood Institute: www.nhlbi.nih.gov °Summary °Body mass index (BMI) is a number that is calculated from a person's weight and height. °BMI may help estimate how much of a person's weight is composed of fat. BMI can help identify those who may be at higher risk for certain medical problems. °BMI can be measured using English measurements or metric measurements. °BMI charts are used to identify whether you are underweight, normal weight, overweight, or obese. °This information is not intended to replace advice given to you by your health care provider. Make sure you discuss any questions you have with your health care provider. °Document  Revised: 03/14/2019 Document   Reviewed: 01/19/2019 °Elsevier Patient Education © 2022 [employer] ° °

## 2021-08-12 ENCOUNTER — Encounter: Payer: Self-pay | Admitting: Internal Medicine

## 2021-10-28 ENCOUNTER — Other Ambulatory Visit: Payer: Self-pay | Admitting: Internal Medicine

## 2021-10-29 NOTE — Telephone Encounter (Signed)
Requested Prescriptions  ?Pending Prescriptions Disp Refills  ?? valsartan-hydrochlorothiazide (DIOVAN-HCT) 80-12.5 MG tablet [Pharmacy Med Name: VALSARTAN-HCTZ 80-12.5 MG TAB] 90 tablet 1  ?  Sig: Take 1 tablet by mouth daily.  ?  ? Cardiovascular: ARB + Diuretic Combos Passed - 10/28/2021  2:22 PM  ?  ?  Passed - K in normal range and within 180 days  ?  Potassium  ?Date Value Ref Range Status  ?08/04/2021 4.2 3.5 - 5.2 mmol/L Final  ?   ?  ?  Passed - Na in normal range and within 180 days  ?  Sodium  ?Date Value Ref Range Status  ?08/04/2021 140 134 - 144 mmol/L Final  ?   ?  ?  Passed - Cr in normal range and within 180 days  ?  Creatinine, Ser  ?Date Value Ref Range Status  ?08/04/2021 1.20 0.76 - 1.27 mg/dL Final  ?   ?  ?  Passed - eGFR is 10 or above and within 180 days  ?  GFR, Estimated  ?Date Value Ref Range Status  ?08/26/2020 >60 >60 mL/min Final  ?  Comment:  ?  (NOTE) ?Calculated using the CKD-EPI Creatinine Equation (2021) ?  ? ?eGFR  ?Date Value Ref Range Status  ?08/04/2021 68 >59 mL/min/1.73 Final  ?   ?  ?  Passed - Patient is not pregnant  ?  ?  Passed - Last BP in normal range  ?  BP Readings from Last 1 Encounters:  ?08/11/21 122/84  ?   ?  ?  Passed - Valid encounter within last 6 months  ?  Recent Outpatient Visits   ?      ? 2 months ago Primary hypertension  ? [facility] Vigg, Avanti, MD  ? 4 months ago Primary hypertension  ? [facility] Vigg, Avanti, MD  ? 6 months ago Hypothyroidism, unspecified type  ? [facility] Vigg, Avanti, MD  ? 9 months ago Primary hypertension  ? [facility] Vigg, Avanti, MD  ? 10 months ago Hypothyroidism, unspecified type  ? [facility] Vigg, Avanti, MD  ?  ?  ?Future Appointments   ?        ? In 3 months Vigg, Avanti, MD [facility]  ? In 4 months Hollice Espy, MD [place]  ?  ? ?  ?  ?  ?? rosuvastatin (CRESTOR) 10 MG tablet [Pharmacy Med Name:  ROSUVASTATIN CALCIUM 10 MG TAB] 90 tablet 1  ?  Sig: Take 1 tablet (10 mg total) by mouth daily.  ?  ? Cardiovascular:  Antilipid - Statins 2 Failed - 10/28/2021  2:22 PM  ?  ?  Failed - Lipid Panel in normal range within the last 12 months  ?  Cholesterol, Total  ?Date Value Ref Range Status  ?08/04/2021 150 100 - 199 mg/dL Final  ? ?LDL Chol Calc (NIH)  ?Date Value Ref Range Status  ?08/04/2021 75 0 - 99 mg/dL Final  ? ?HDL  ?Date Value Ref Range Status  ?08/04/2021 46 >39 mg/dL Final  ? ?Triglycerides  ?Date Value Ref Range Status  ?08/04/2021 168 (H) 0 - 149 mg/dL Final  ? ?  ?  ?  Passed - Cr in normal range and within 360 days  ?  Creatinine, Ser  ?Date Value Ref Range Status  ?08/04/2021 1.20 0.76 - 1.27 mg/dL Final  ?   ?  ?  Passed - Patient is  not pregnant  ?  ?  Passed - Valid encounter within last 12 months  ?  Recent Outpatient Visits   ?      ? 2 months ago Primary hypertension  ? [facility] Vigg, Avanti, MD  ? 4 months ago Primary hypertension  ? [facility] Vigg, Avanti, MD  ? 6 months ago Hypothyroidism, unspecified type  ? [facility] Vigg, Avanti, MD  ? 9 months ago Primary hypertension  ? [facility] Vigg, Avanti, MD  ? 10 months ago Hypothyroidism, unspecified type  ? [facility] Vigg, Avanti, MD  ?  ?  ?Future Appointments   ?        ? In 3 months Vigg, Avanti, MD [facility]  ? In 4 months Hollice Espy, MD [place]  ?  ? ?  ?  ?  ?? levothyroxine (SYNTHROID) 150 MCG tablet [Pharmacy Med Name: LEVOTHYROXINE 150 MCG TABLET] 90 tablet 1  ?  Sig: Take 1 tablet (150 mcg total) by mouth daily.  ?  ? Endocrinology:  Hypothyroid Agents Failed - 10/28/2021  2:22 PM  ?  ?  Failed - TSH in normal range and within 360 days  ?  TSH  ?Date Value Ref Range Status  ?08/04/2021 7.190 (H) 0.450 - 4.500 uIU/mL Final  ?   ?  ?  Passed - Valid encounter within last 12 months  ?  Recent Outpatient Visits   ?       ? 2 months ago Primary hypertension  ? [facility] Vigg, Avanti, MD  ? 4 months ago Primary hypertension  ? [facility] Vigg, Avanti, MD  ? 6 months ago Hypothyroidism, unspecified type  ? [facility] Vigg, Avanti, MD  ? 9 months ago Primary hypertension  ? [facility] Vigg, Avanti, MD  ? 10 months ago Hypothyroidism, unspecified type  ? [facility] Vigg, Avanti, MD  ?  ?  ?Future Appointments   ?        ? In 3 months Vigg, Avanti, MD [facility]  ? In 4 months Hollice Espy, MD [place]  ?  ? ?  ?  ?  ? ? ?

## 2021-12-31 ENCOUNTER — Telehealth: Payer: Self-pay | Admitting: Acute Care

## 2021-12-31 NOTE — Telephone Encounter (Signed)
Reached pt to schedule LDCT.  He stated that he wanted to check with his doctor first-has appt in July and she wants him to, he would. Program phone number given to call back to schedule.

## 2022-02-09 ENCOUNTER — Encounter: Payer: Self-pay | Admitting: Unknown Physician Specialty

## 2022-02-09 ENCOUNTER — Ambulatory Visit: Payer: 59 | Admitting: Unknown Physician Specialty

## 2022-02-09 VITALS — BP 122/75 | HR 73 | Temp 98.9°F | Ht 72.05 in | Wt 259.6 lb

## 2022-02-09 DIAGNOSIS — E039 Hypothyroidism, unspecified: Secondary | ICD-10-CM

## 2022-02-09 DIAGNOSIS — I10 Essential (primary) hypertension: Secondary | ICD-10-CM

## 2022-02-09 DIAGNOSIS — E785 Hyperlipidemia, unspecified: Secondary | ICD-10-CM

## 2022-02-09 DIAGNOSIS — F17200 Nicotine dependence, unspecified, uncomplicated: Secondary | ICD-10-CM

## 2022-02-09 DIAGNOSIS — R69 Illness, unspecified: Secondary | ICD-10-CM | POA: Diagnosis not present

## 2022-02-09 MED ORDER — MELOXICAM 7.5 MG PO TABS: 7.5000 mg | ORAL_TABLET | Freq: Every day | ORAL | 2 refills | Status: DC

## 2022-02-09 NOTE — Assessment & Plan Note (Signed)
Due for LDCT.  Pt has number and will call back

## 2022-02-09 NOTE — Progress Notes (Signed)
BP 122/75   Pulse 73   Temp 98.9 F (37.2 C) (Oral)   Ht 6' 0.05" (1.83 m)   Wt 259 lb 9.6 oz (117.8 kg)   SpO2 97%   BMI 35.16 kg/m    Subjective:    Patient ID: Isaiah Brown, male    DOB: 1961/05/28, 61 y.o.   MRN: [id number]  HPI: Isaiah Brown is a 61 y.o. male  Chief Complaint  Patient presents with   Hypertension   Hypothyroidism   Dry areas on Face    Around nose and on right ear   Hypertension Using medications without difficulty Average home BPs Not checking  No problems or lightheadedness Does have difficulty breathing with exertion No Edema  Hyperlipidemia Using medications without problems: No Muscle aches  Diet compliance: Exercise: Reads a lot.  Not getting much exercise  COPD States he is currently doing better than in the past. No recent COPD flares   Hypothyroid On Synthroid 142m.  Doing well with good energy.  No labs since increase dose 6 months ago.    Chronic back On disability for this.  Manages OK as he is able to moderate activities   Relevant past medical, surgical, family and social history reviewed and updated as indicated. Interim medical history since our last visit reviewed. Allergies and medications reviewed and updated.  Review of Systems  Per HPI unless specifically indicated above     Objective:    BP 122/75   Pulse 73   Temp 98.9 F (37.2 C) (Oral)   Ht 6' 0.05" (1.83 m)   Wt 259 lb 9.6 oz (117.8 kg)   SpO2 97%   BMI 35.16 kg/m   Wt Readings from Last 3 Encounters:  02/09/22 259 lb 9.6 oz (117.8 kg)  08/11/21 269 lb 9.6 oz (122.3 kg)  06/02/21 264 lb 9.6 oz (120 kg)    Physical Exam Constitutional:      General: He is not in acute distress.    Appearance: Normal appearance. He is well-developed.  HENT:     Head: Normocephalic and atraumatic.  Eyes:     General: Lids are normal. No scleral icterus.       Right eye: No discharge.        Left eye: No discharge.     Conjunctiva/sclera: Conjunctivae  normal.  Neck:     Vascular: No carotid bruit or JVD.  Cardiovascular:     Rate and Rhythm: Normal rate and regular rhythm.     Heart sounds: Normal heart sounds.  Pulmonary:     Effort: Pulmonary effort is normal. No respiratory distress.     Breath sounds: Normal breath sounds.  Abdominal:     Palpations: There is no hepatomegaly or splenomegaly.  Musculoskeletal:        General: Normal range of motion.     Cervical back: Normal range of motion and neck supple.  Skin:    General: Skin is warm and dry.     Coloration: Skin is not pale.     Findings: No rash.  Neurological:     Mental Status: He is alert and oriented to person, place, and time.  Psychiatric:        Behavior: Behavior normal.        Thought Content: Thought content normal.        Judgment: Judgment normal.     Results for orders placed or performed in visit on 08/04/21  Comp Met (CMET)  Result Value Ref Range  Glucose 85 70 - 99 mg/dL   BUN 14 8 - 27 mg/dL   Creatinine, Ser 1.20 0.76 - 1.27 mg/dL   eGFR 68 >59 mL/min/1.73   BUN/Creatinine Ratio 12 10 - 24   Sodium 140 134 - 144 mmol/L   Potassium 4.2 3.5 - 5.2 mmol/L   Chloride 102 96 - 106 mmol/L   CO2 19 (L) 20 - 29 mmol/L   Calcium 9.5 8.6 - 10.2 mg/dL   Total Protein 7.1 6.0 - 8.5 g/dL   Albumin 4.5 3.8 - 4.8 g/dL   Globulin, Total 2.6 1.5 - 4.5 g/dL   Albumin/Globulin Ratio 1.7 1.2 - 2.2   Bilirubin Total 0.4 0.0 - 1.2 mg/dL   Alkaline Phosphatase 60 44 - 121 IU/L   AST 19 0 - 40 IU/L   ALT 14 0 - 44 IU/L  PSA  Result Value Ref Range   Prostate Specific Ag, Serum 0.7 0.0 - 4.0 ng/mL  CBC with Differential/Platelet  Result Value Ref Range   WBC 8.4 3.4 - 10.8 x10E3/uL   RBC 5.48 4.14 - 5.80 x10E6/uL   Hemoglobin 18.0 (H) 13.0 - 17.7 g/dL   Hematocrit 51.1 (H) 37.5 - 51.0 %   MCV 93 79 - 97 fL   MCH 32.8 26.6 - 33.0 pg   MCHC 35.2 31.5 - 35.7 g/dL   RDW 12.5 11.6 - 15.4 %   Platelets 212 150 - 450 x10E3/uL   Neutrophils 59 Not Estab. %    Lymphs 29 Not Estab. %   Monocytes 8 Not Estab. %   Eos 2 Not Estab. %   Basos 1 Not Estab. %   Neutrophils Absolute 5.0 1.4 - 7.0 x10E3/uL   Lymphocytes Absolute 2.5 0.7 - 3.1 x10E3/uL   Monocytes Absolute 0.7 0.1 - 0.9 x10E3/uL   EOS (ABSOLUTE) 0.2 0.0 - 0.4 x10E3/uL   Basophils Absolute 0.1 0.0 - 0.2 x10E3/uL   Immature Granulocytes 1 Not Estab. %   Immature Grans (Abs) 0.0 0.0 - 0.1 x10E3/uL  Lipid panel  Result Value Ref Range   Cholesterol, Total 150 100 - 199 mg/dL   Triglycerides 168 (H) 0 - 149 mg/dL   HDL 46 >39 mg/dL   VLDL Cholesterol Cal 29 5 - 40 mg/dL   LDL Chol Calc (NIH) 75 0 - 99 mg/dL   Chol/HDL Ratio 3.3 0.0 - 5.0 ratio  TSH  Result Value Ref Range   TSH 7.190 (H) 0.450 - 4.500 uIU/mL      Assessment & Plan:   Problem List Items Addressed This Visit       Unprioritized   Hyperlipidemia - Primary    Check Cholesterol today.  Last LDL was 75      Relevant Orders   Lipid Panel w/o Chol/HDL Ratio   Hypothyroidism    Due to be checked today      Relevant Orders   TSH   Primary hypertension    Stable, continue present medications.        Relevant Orders   Comprehensive metabolic panel   Smoking addiction    Due for LDCT.  Pt has number and will call back        Follow up plan: No follow-ups on file.

## 2022-02-09 NOTE — Assessment & Plan Note (Signed)
Check Cholesterol today.  Last LDL was 75

## 2022-02-09 NOTE — Assessment & Plan Note (Signed)
Stable, continue present medications.   

## 2022-02-09 NOTE — Assessment & Plan Note (Signed)
Due to be checked today

## 2022-02-09 NOTE — Progress Notes (Signed)
BP 122/75   Pulse 73   Temp 98.9 F (37.2 C) (Oral)   Ht 6' 0.05" (1.83 m)   Wt 259 lb 9.6 oz (117.8 kg)   SpO2 97%   BMI 35.16 kg/m    Subjective:    Patient ID: Isaiah Brown, male    DOB: 01-08-1961, 61 y.o.   MRN: [id number]  HPI: Isaiah Brown is a 61 y.o. male  Chief Complaint  Patient presents with   Hypertension   Hypothyroidism   Dry areas on Face    Around nose and on right ear   Hypertension Using medications without difficulty Average home BPs   No problems or lightheadedness shortness of breath with activity No Edema   Hyperlipidemia Using medications without problems: No Muscle aches  Diet compliance:Exercise: Reads a lot.  Eats what he wants  Eczema Areas of dryness right ear and t zone  COPD Doing well and able to do most activities  Hypothyroidism  No labs since increase to 150 mcgs 6 months ago  Relevant past medical, surgical, family and social history reviewed and updated as indicated. Interim medical history since our last visit reviewed. Allergies and medications reviewed and updated.  Review of Systems  Constitutional: Negative.   Respiratory: Negative.    Cardiovascular: Negative.   Gastrointestinal: Negative.   Neurological: Negative.   Psychiatric/Behavioral: Negative.      Per HPI unless specifically indicated above     Objective:    BP 122/75   Pulse 73   Temp 98.9 F (37.2 C) (Oral)   Ht 6' 0.05" (1.83 m)   Wt 259 lb 9.6 oz (117.8 kg)   SpO2 97%   BMI 35.16 kg/m   Wt Readings from Last 3 Encounters:  02/09/22 259 lb 9.6 oz (117.8 kg)  08/11/21 269 lb 9.6 oz (122.3 kg)  06/02/21 264 lb 9.6 oz (120 kg)    Physical Exam Constitutional:      General: He is not in acute distress.    Appearance: Normal appearance. He is well-developed.  HENT:     Head: Normocephalic and atraumatic.  Eyes:     General: Lids are normal. No scleral icterus.       Right eye: No discharge.        Left eye: No discharge.      Conjunctiva/sclera: Conjunctivae normal.  Neck:     Vascular: No carotid bruit or JVD.  Cardiovascular:     Rate and Rhythm: Normal rate and regular rhythm.     Heart sounds: Normal heart sounds.  Pulmonary:     Effort: Pulmonary effort is normal. No respiratory distress.     Breath sounds: Normal breath sounds.  Abdominal:     Palpations: There is no hepatomegaly or splenomegaly.  Musculoskeletal:        General: Normal range of motion.     Cervical back: Normal range of motion and neck supple.  Skin:    General: Skin is warm and dry.     Coloration: Skin is not pale.     Findings: No rash.  Neurological:     Mental Status: He is alert and oriented to person, place, and time.  Psychiatric:        Behavior: Behavior normal.        Thought Content: Thought content normal.        Judgment: Judgment normal.     Results for orders placed or performed in visit on 08/04/21  Comp Met (CMET)  Result Value  Ref Range   Glucose 85 70 - 99 mg/dL   BUN 14 8 - 27 mg/dL   Creatinine, Ser 1.20 0.76 - 1.27 mg/dL   eGFR 68 >59 mL/min/1.73   BUN/Creatinine Ratio 12 10 - 24   Sodium 140 134 - 144 mmol/L   Potassium 4.2 3.5 - 5.2 mmol/L   Chloride 102 96 - 106 mmol/L   CO2 19 (L) 20 - 29 mmol/L   Calcium 9.5 8.6 - 10.2 mg/dL   Total Protein 7.1 6.0 - 8.5 g/dL   Albumin 4.5 3.8 - 4.8 g/dL   Globulin, Total 2.6 1.5 - 4.5 g/dL   Albumin/Globulin Ratio 1.7 1.2 - 2.2   Bilirubin Total 0.4 0.0 - 1.2 mg/dL   Alkaline Phosphatase 60 44 - 121 IU/L   AST 19 0 - 40 IU/L   ALT 14 0 - 44 IU/L  PSA  Result Value Ref Range   Prostate Specific Ag, Serum 0.7 0.0 - 4.0 ng/mL  CBC with Differential/Platelet  Result Value Ref Range   WBC 8.4 3.4 - 10.8 x10E3/uL   RBC 5.48 4.14 - 5.80 x10E6/uL   Hemoglobin 18.0 (H) 13.0 - 17.7 g/dL   Hematocrit 51.1 (H) 37.5 - 51.0 %   MCV 93 79 - 97 fL   MCH 32.8 26.6 - 33.0 pg   MCHC 35.2 31.5 - 35.7 g/dL   RDW 12.5 11.6 - 15.4 %   Platelets 212 150 - 450  x10E3/uL   Neutrophils 59 Not Estab. %   Lymphs 29 Not Estab. %   Monocytes 8 Not Estab. %   Eos 2 Not Estab. %   Basos 1 Not Estab. %   Neutrophils Absolute 5.0 1.4 - 7.0 x10E3/uL   Lymphocytes Absolute 2.5 0.7 - 3.1 x10E3/uL   Monocytes Absolute 0.7 0.1 - 0.9 x10E3/uL   EOS (ABSOLUTE) 0.2 0.0 - 0.4 x10E3/uL   Basophils Absolute 0.1 0.0 - 0.2 x10E3/uL   Immature Granulocytes 1 Not Estab. %   Immature Grans (Abs) 0.0 0.0 - 0.1 x10E3/uL  Lipid panel  Result Value Ref Range   Cholesterol, Total 150 100 - 199 mg/dL   Triglycerides 168 (H) 0 - 149 mg/dL   HDL 46 >39 mg/dL   VLDL Cholesterol Cal 29 5 - 40 mg/dL   LDL Chol Calc (NIH) 75 0 - 99 mg/dL   Chol/HDL Ratio 3.3 0.0 - 5.0 ratio  TSH  Result Value Ref Range   TSH 7.190 (H) 0.450 - 4.500 uIU/mL      Assessment & Plan:   Problem List Items Addressed This Visit       Unprioritized   Hyperlipidemia - Primary    Check Cholesterol today.  Last LDL was 75      Relevant Orders   Lipid Panel w/o Chol/HDL Ratio   Hypothyroidism    Due to be checked today      Relevant Orders   TSH   Primary hypertension    Stable, continue present medications.        Relevant Orders   Comprehensive metabolic panel   Smoking addiction    Due for LDCT.  Pt has number and will call back        Follow up plan: Return in about 6 months (around 08/12/2022).

## 2022-02-10 LAB — COMPREHENSIVE METABOLIC PANEL
ALT: 17 IU/L (ref 0–44)
AST: 20 IU/L (ref 0–40)
Albumin/Globulin Ratio: 1.9 (ref 1.2–2.2)
Albumin: 4.8 g/dL (ref 3.9–4.9)
Alkaline Phosphatase: 59 IU/L (ref 44–121)
BUN/Creatinine Ratio: 11 (ref 10–24)
BUN: 12 mg/dL (ref 8–27)
Bilirubin Total: 0.5 mg/dL (ref 0.0–1.2)
CO2: 26 mmol/L (ref 20–29)
Calcium: 9.9 mg/dL (ref 8.6–10.2)
Chloride: 103 mmol/L (ref 96–106)
Creatinine, Ser: 1.14 mg/dL (ref 0.76–1.27)
Globulin, Total: 2.5 g/dL (ref 1.5–4.5)
Glucose: 78 mg/dL (ref 70–99)
Potassium: 4.6 mmol/L (ref 3.5–5.2)
Sodium: 143 mmol/L (ref 134–144)
Total Protein: 7.3 g/dL (ref 6.0–8.5)
eGFR: 72 mL/min/{1.73_m2} (ref >59)

## 2022-02-10 LAB — LIPID PANEL W/O CHOL/HDL RATIO
Cholesterol, Total: 139 mg/dL (ref 100–199)
HDL: 40 mg/dL (ref >39)
LDL Chol Calc (NIH): 69 mg/dL (ref 0–99)
Triglycerides: 178 mg/dL — ABNORMAL HIGH (ref 0–149)
VLDL Cholesterol Cal: 30 mg/dL (ref 5–40)

## 2022-02-10 LAB — TSH: TSH: 0.866 u[IU]/mL (ref 0.450–4.500)

## 2022-03-03 ENCOUNTER — Ambulatory Visit: Payer: 59 | Admitting: Urology

## 2022-04-19 NOTE — Progress Notes (Unsigned)
   There were no vitals taken for this visit.   Subjective:    Patient ID: Isaiah Brown, male    DOB: June 06, 1961, 61 y.o.   MRN: [id number]  HPI: Isaiah Brown is a 61 y.o. male  No chief complaint on file.  EYE PAIN Duration:  {Blank single:19197::"days","weeks","months"} Involved eye:  {Blank single:19197::"left","right","bilateral"} Onset: {Blank single:19197::"sudden","gradual"} Severity: {Blank single:19197::"mild","moderate","severe","1/10","2/10","3/10","4/10","5/10","6/10","7/10","8/10","9/10","10/10"}  Quality: {Blank multiple:19196::"sharp","dull","aching","burning","cramping","ill-defined","itchy","pressure-like","pulling","shooting","sore","stabbing","tender","tearing","throbbing"} Foreign body sensation:{Blank single:19197::"yes","no"} Visual impairment: {Blank single:19197::"yes","no"} Eye redness: {Blank single:19197::"yes","no"} Discharge: {Blank single:19197::"yes","no"} Crusting or matting of eyelids: {Blank single:19197::"yes","no"} Swelling: {Blank single:19197::"yes","no"} Photophobia: {Blank single:19197::"yes","no"} Itching: {Blank single:19197::"yes","no"} Tearing: {Blank single:19197::"yes","no"} Headache: {Blank single:19197::"yes","no"} Floaters: {Blank single:19197::"yes","no"} URI symptoms: {Blank single:19197::"yes","no"} Contact lens use: {Blank single:19197::"yes","no"} Close contacts with similar problems: {Blank single:19197::"yes","no"} Eye trauma: {Blank single:19197::"yes","no"} Aggravating factors:  Alleviating factors:  Status: {Blank multiple:19196::"better","worse","stable","fluctuating"} Treatments attempted:  Relevant past medical, surgical, family and social history reviewed and updated as indicated. Interim medical history since our last visit reviewed. Allergies and medications reviewed and updated.  Review of Systems  Per HPI unless specifically indicated above     Objective:    There were no vitals taken for this visit.   Wt Readings from Last 3 Encounters:  02/09/22 259 lb 9.6 oz (117.8 kg)  08/11/21 269 lb 9.6 oz (122.3 kg)  06/02/21 264 lb 9.6 oz (120 kg)    Physical Exam  Results for orders placed or performed in visit on 02/09/22  Comprehensive metabolic panel  Result Value Ref Range   Glucose 78 70 - 99 mg/dL   BUN 12 8 - 27 mg/dL   Creatinine, Ser 1.14 0.76 - 1.27 mg/dL   eGFR 72 >59 mL/min/1.73   BUN/Creatinine Ratio 11 10 - 24   Sodium 143 134 - 144 mmol/L   Potassium 4.6 3.5 - 5.2 mmol/L   Chloride 103 96 - 106 mmol/L   CO2 26 20 - 29 mmol/L   Calcium 9.9 8.6 - 10.2 mg/dL   Total Protein 7.3 6.0 - 8.5 g/dL   Albumin 4.8 3.9 - 4.9 g/dL   Globulin, Total 2.5 1.5 - 4.5 g/dL   Albumin/Globulin Ratio 1.9 1.2 - 2.2   Bilirubin Total 0.5 0.0 - 1.2 mg/dL   Alkaline Phosphatase 59 44 - 121 IU/L   AST 20 0 - 40 IU/L   ALT 17 0 - 44 IU/L  Lipid Panel w/o Chol/HDL Ratio  Result Value Ref Range   Cholesterol, Total 139 100 - 199 mg/dL   Triglycerides 178 (H) 0 - 149 mg/dL   HDL 40 >39 mg/dL   VLDL Cholesterol Cal 30 5 - 40 mg/dL   LDL Chol Calc (NIH) 69 0 - 99 mg/dL  TSH  Result Value Ref Range   TSH 0.866 0.450 - 4.500 uIU/mL      Assessment & Plan:   Problem List Items Addressed This Visit   None    Follow up plan: No follow-ups on file.

## 2022-04-20 ENCOUNTER — Encounter: Payer: Self-pay | Admitting: Nurse Practitioner

## 2022-04-20 ENCOUNTER — Ambulatory Visit: Payer: 59 | Admitting: Nurse Practitioner

## 2022-04-20 VITALS — BP 125/86 | HR 82 | Temp 98.2°F | Wt 262.5 lb

## 2022-04-20 DIAGNOSIS — Z1211 Encounter for screening for malignant neoplasm of colon: Secondary | ICD-10-CM | POA: Diagnosis not present

## 2022-04-20 DIAGNOSIS — H00011 Hordeolum externum right upper eyelid: Secondary | ICD-10-CM | POA: Diagnosis not present

## 2022-04-20 DIAGNOSIS — Z23 Encounter for immunization: Secondary | ICD-10-CM

## 2022-04-20 DIAGNOSIS — R69 Illness, unspecified: Secondary | ICD-10-CM | POA: Diagnosis not present

## 2022-04-20 DIAGNOSIS — F17200 Nicotine dependence, unspecified, uncomplicated: Secondary | ICD-10-CM | POA: Diagnosis not present

## 2022-04-20 NOTE — Assessment & Plan Note (Signed)
Chronic. Smoking 1-2 ppd.  CT Lung ordered at visit today.  Needs pneumonia shot.  Will discuss at next visit.  Flu shot given today.

## 2022-04-22 ENCOUNTER — Encounter: Payer: Self-pay | Admitting: *Deleted

## 2022-05-12 ENCOUNTER — Other Ambulatory Visit: Payer: Self-pay | Admitting: Unknown Physician Specialty

## 2022-05-12 ENCOUNTER — Other Ambulatory Visit: Payer: Self-pay | Admitting: Nurse Practitioner

## 2022-05-12 NOTE — Telephone Encounter (Signed)
Requested Prescriptions  Pending Prescriptions Disp Refills   albuterol (VENTOLIN HFA) 108 (90 Base) MCG/ACT inhaler [Pharmacy Med Name: ALBUTEROL HFA 90 MCG INHALER] 8.5 g 1    Sig: Inhale 2 puffs into the lungs every 6 (six) hours as needed for wheezing or shortness of breath.     Pulmonology:  Beta Agonists 2 Passed - 05/12/2022 10:57 AM      Passed - Last BP in normal range    BP Readings from Last 1 Encounters:  04/20/22 125/86         Passed - Last Heart Rate in normal range    Pulse Readings from Last 1 Encounters:  04/20/22 82         Passed - Valid encounter within last 12 months    Recent Outpatient Visits           3 weeks ago Hordeolum externum of right upper eyelid   [facility] Larae Grooms, NP   3 months ago Hyperlipidemia, unspecified hyperlipidemia type   [facility] Gabriel Cirri, NP   9 months ago Primary hypertension   [facility] Vigg, Avanti, MD   11 months ago Primary hypertension   [facility] Vigg, Avanti, MD   1 year ago Hypothyroidism, unspecified type   [facility] Vigg, Avanti, MD       Future Appointments             In 3 months Larae Grooms, NP [facility]

## 2022-05-12 NOTE — Telephone Encounter (Signed)
Requested Prescriptions  Pending Prescriptions Disp Refills   meloxicam (MOBIC) 7.5 MG tablet [Pharmacy Med Name: MELOXICAM 7.5 MG TABLET] 30 tablet 2    Sig: Take 1 tablet (7.5 mg total) by mouth daily.     Analgesics:  COX2 Inhibitors Failed - 05/12/2022 11:01 AM      Failed - Manual Review: Labs are only required if the patient has taken medication for more than 8 weeks.      Failed - HGB in normal range and within 360 days    Hemoglobin  Date Value Ref Range Status  08/04/2021 18.0 (H) 13.0 - 17.7 g/dL Final         Failed - HCT in normal range and within 360 days    Hematocrit  Date Value Ref Range Status  08/04/2021 51.1 (H) 37.5 - 51.0 % Final         Passed - Cr in normal range and within 360 days    Creatinine, Ser  Date Value Ref Range Status  02/09/2022 1.14 0.76 - 1.27 mg/dL Final         Passed - AST in normal range and within 360 days    AST  Date Value Ref Range Status  02/09/2022 20 0 - 40 IU/L Final         Passed - ALT in normal range and within 360 days    ALT  Date Value Ref Range Status  02/09/2022 17 0 - 44 IU/L Final         Passed - eGFR is 30 or above and within 360 days    GFR, Estimated  Date Value Ref Range Status  08/26/2020 >60 >60 mL/min Final    Comment:    (NOTE) Calculated using the CKD-EPI Creatinine Equation (2021)    eGFR  Date Value Ref Range Status  02/09/2022 72 >59 mL/min/1.73 Final         Passed - Patient is not pregnant      Passed - Valid encounter within last 12 months    Recent Outpatient Visits           3 weeks ago Hordeolum externum of right upper eyelid   [place] [place] Glad, NP   3 months ago Hyperlipidemia, unspecified hyperlipidemia type   [facility] Kathrine Haddock, NP   9 months ago Primary hypertension   [facility] Vigg, Avanti, MD   11 months ago Primary hypertension   [place] Vigg, Avanti, MD   1 year ago Hypothyroidism,  unspecified type   [place], MD       Future Appointments             In 3 months Jon Billings, NP [facility]             valsartan-hydrochlorothiazide (DIOVAN-HCT) 80-12.5 MG tablet [Pharmacy Med Name: VALSARTAN-HCTZ 80-12.5 MG TAB] 30 tablet 2    Sig: Take 1 tablet by mouth daily.     Cardiovascular: ARB + Diuretic Combos Passed - 05/12/2022 11:01 AM      Passed - K in normal range and within 180 days    Potassium  Date Value Ref Range Status  02/09/2022 4.6 3.5 - 5.2 mmol/L Final         Passed - Na in normal range and within 180 days    Sodium  Date Value Ref Range Status  02/09/2022 143 134 - 144 mmol/L Final  Passed - Cr in normal range and within 180 days    Creatinine, Ser  Date Value Ref Range Status  02/09/2022 1.14 0.76 - 1.27 mg/dL Final         Passed - eGFR is 10 or above and within 180 days    GFR, Estimated  Date Value Ref Range Status  08/26/2020 >60 >60 mL/min Final    Comment:    (NOTE) Calculated using the CKD-EPI Creatinine Equation (2021)    eGFR  Date Value Ref Range Status  02/09/2022 72 >59 mL/min/1.73 Final         Passed - Patient is not pregnant      Passed - Last BP in normal range    BP Readings from Last 1 Encounters:  04/20/22 125/86         Passed - Valid encounter within last 6 months    Recent Outpatient Visits           3 weeks ago Hordeolum externum of right upper eyelid   [facility] Jon Billings, NP   3 months ago Hyperlipidemia, unspecified hyperlipidemia type   [facility] Kathrine Haddock, NP   9 months ago Primary hypertension   [facility] Vigg, Avanti, MD   11 months ago Primary hypertension   [place] Vigg, Avanti, MD   1 year ago Hypothyroidism, unspecified type   [place] Vigg, Avanti, MD       Future Appointments             In 3 months Jon Billings, NP  [facility] [place]

## 2022-05-12 NOTE — Telephone Encounter (Signed)
Requested Prescriptions  Pending Prescriptions Disp Refills   rosuvastatin (CRESTOR) 10 MG tablet [Pharmacy Med Name: ROSUVASTATIN CALCIUM 10 MG TAB] 90 tablet 0    Sig: Take 1 tablet (10 mg total) by mouth daily.     Cardiovascular:  Antilipid - Statins 2 Failed - 05/12/2022 11:03 AM      Failed - Lipid Panel in normal range within the last 12 months    Cholesterol, Total  Date Value Ref Range Status  02/09/2022 139 100 - 199 mg/dL Final   LDL Chol Calc (NIH)  Date Value Ref Range Status  02/09/2022 69 0 - 99 mg/dL Final   HDL  Date Value Ref Range Status  02/09/2022 40 >39 mg/dL Final   Triglycerides  Date Value Ref Range Status  02/09/2022 178 (H) 0 - 149 mg/dL Final         Passed - Cr in normal range and within 360 days    Creatinine, Ser  Date Value Ref Range Status  02/09/2022 1.14 0.76 - 1.27 mg/dL Final         Passed - Patient is not pregnant      Passed - Valid encounter within last 12 months    Recent Outpatient Visits           3 weeks ago Hordeolum externum of right upper eyelid   [facility] Larae Grooms, NP   3 months ago Hyperlipidemia, unspecified hyperlipidemia type   [facility] Gabriel Cirri, NP   9 months ago Primary hypertension   [facility] Vigg, Avanti, MD   11 months ago Primary hypertension   [facility] Vigg, Avanti, MD   1 year ago Hypothyroidism, unspecified type   [facility] Vigg, Avanti, MD       Future Appointments             In 3 months Larae Grooms, NP [facility], [facility]             levothyroxine (SYNTHROID) 150 MCG tablet [Pharmacy Med Name: LEVOTHYROXINE 150 MCG TABLET] 90 tablet 0    Sig: Take 1 tablet (150 mcg total) by mouth daily.     Endocrinology:  Hypothyroid Agents Passed - 05/12/2022 11:03 AM      Passed - TSH in normal range and within 360 days    TSH  Date Value Ref Range Status  02/09/2022 0.866 0.450 - 4.500  uIU/mL Final         Passed - Valid encounter within last 12 months    Recent Outpatient Visits           3 weeks ago Hordeolum externum of right upper eyelid   [facility] Larae Grooms, NP   3 months ago Hyperlipidemia, unspecified hyperlipidemia type   [facility] Gabriel Cirri, NP   9 months ago Primary hypertension   [facility] Vigg, Avanti, MD   11 months ago Primary hypertension   [facility] Vigg, Avanti, MD   1 year ago Hypothyroidism, unspecified type   [facility] Vigg, Avanti, MD       Future Appointments             In 3 months Larae Grooms, NP [facility], [facility]

## 2022-08-24 ENCOUNTER — Other Ambulatory Visit: Payer: Self-pay | Admitting: Nurse Practitioner

## 2022-08-24 ENCOUNTER — Ambulatory Visit: Payer: Medicare Other | Admitting: Nurse Practitioner

## 2022-08-25 NOTE — Telephone Encounter (Signed)
Requested medication (s) are due for refill today: yes  Requested medication (s) are on the active medication list: yes  Last refill:  05/12/22 #30/2  Future visit scheduled: yes   Notes to clinic:  Unable to refill per protocol due to failed labs, no updated results.    Requested Prescriptions  Pending Prescriptions Disp Refills   valsartan-hydrochlorothiazide (DIOVAN-HCT) 80-12.5 MG tablet [Pharmacy Med Name: VALSARTAN-HCTZ 80-12.5 MG TAB] 90 tablet 0    Sig: Take 1 tablet by mouth daily.     Cardiovascular: ARB + Diuretic Combos Failed - 08/24/2022  6:17 PM      Failed - K in normal range and within 180 days    Potassium  Date Value Ref Range Status  02/09/2022 4.6 3.5 - 5.2 mmol/L Final         Failed - Na in normal range and within 180 days    Sodium  Date Value Ref Range Status  02/09/2022 143 134 - 144 mmol/L Final         Failed - Cr in normal range and within 180 days    Creatinine, Ser  Date Value Ref Range Status  02/09/2022 1.14 0.76 - 1.27 mg/dL Final         Failed - eGFR is 10 or above and within 180 days    GFR, Estimated  Date Value Ref Range Status  08/26/2020 >60 >60 mL/min Final    Comment:    (NOTE) Calculated using the CKD-EPI Creatinine Equation (2021)    eGFR  Date Value Ref Range Status  02/09/2022 72 >59 mL/min/1.73 Final         Passed - Patient is not pregnant      Passed - Last BP in normal range    BP Readings from Last 1 Encounters:  04/20/22 125/86         Passed - Valid encounter within last 6 months    Recent Outpatient Visits           4 months ago Hordeolum externum of right upper eyelid   [place], NP   6 months ago Hyperlipidemia, unspecified hyperlipidemia type   [place] Kathrine Haddock, NP   1 year ago Primary hypertension   [place] Vigg, Avanti, MD   1 year ago Primary hypertension   [place] Vigg, Avanti, MD   1 year ago Hypothyroidism, unspecified type   [place] Charlynne Cousins, MD       Future Appointments             In 6 days Jon Billings, NP [place], [facility]

## 2022-08-25 NOTE — Telephone Encounter (Signed)
Requested Prescriptions  Pending Prescriptions Disp Refills   levothyroxine (SYNTHROID) 150 MCG tablet [Pharmacy Med Name: LEVOTHYROXINE 150 MCG TABLET] 90 tablet 1    Sig: Take 1 tablet (150 mcg total) by mouth daily.     Endocrinology:  Hypothyroid Agents Passed - 08/24/2022  6:19 PM      Passed - TSH in normal range and within 360 days    TSH  Date Value Ref Range Status  02/09/2022 0.866 0.450 - 4.500 uIU/mL Final         Passed - Valid encounter within last 12 months    Recent Outpatient Visits           4 months ago Hordeolum externum of right upper eyelid   [place], NP   6 months ago Hyperlipidemia, unspecified hyperlipidemia type   [place] Kathrine Haddock, NP   1 year ago Primary hypertension   [place] Vigg, Avanti, MD   1 year ago Primary hypertension   [place] Vigg, Avanti, MD   1 year ago Hypothyroidism, unspecified type   [place] Charlynne Cousins, MD       Future Appointments             In 6 days Jon Billings, NP [place], [facility]

## 2022-08-25 NOTE — Telephone Encounter (Signed)
Requested medication (s) are due for refill today: yes  Requested medication (s) are on the active medication list: yes  Last refill:  05/12/22 #30 2 RF  Future visit scheduled: yes  in 6 days   Notes to clinic:  overdue lab work has upcoming appt soon   Requested Prescriptions  Pending Prescriptions Disp Refills   meloxicam (MOBIC) 7.5 MG tablet [Pharmacy Med Name: MELOXICAM 7.5 MG TABLET] 90 tablet     Sig: Take 1 tablet (7.5 mg total) by mouth daily.     Analgesics:  COX2 Inhibitors Failed - 08/24/2022  6:25 PM      Failed - Manual Review: Labs are only required if the patient has taken medication for more than 8 weeks.      Failed - HGB in normal range and within 360 days    Hemoglobin  Date Value Ref Range Status  08/04/2021 18.0 (H) 13.0 - 17.7 g/dL Final         Failed - HCT in normal range and within 360 days    Hematocrit  Date Value Ref Range Status  08/04/2021 51.1 (H) 37.5 - 51.0 % Final         Passed - Cr in normal range and within 360 days    Creatinine, Ser  Date Value Ref Range Status  02/09/2022 1.14 0.76 - 1.27 mg/dL Final         Passed - AST in normal range and within 360 days    AST  Date Value Ref Range Status  02/09/2022 20 0 - 40 IU/L Final         Passed - ALT in normal range and within 360 days    ALT  Date Value Ref Range Status  02/09/2022 17 0 - 44 IU/L Final         Passed - eGFR is 30 or above and within 360 days    GFR, Estimated  Date Value Ref Range Status  08/26/2020 >60 >60 mL/min Final    Comment:    (NOTE) Calculated using the CKD-EPI Creatinine Equation (2021)    eGFR  Date Value Ref Range Status  02/09/2022 72 >59 mL/min/1.73 Final         Passed - Patient is not pregnant      Passed - Valid encounter within last 12 months    Recent Outpatient Visits           4 months ago Hordeolum externum of right upper eyelid   [place], Karen, NP   6 months ago Hyperlipidemia,  unspecified hyperlipidemia type   [place] Kathrine Haddock, NP   1 year ago Primary hypertension   [place] Vigg, Avanti, MD   1 year ago Primary hypertension   [place] Vigg, Avanti, MD   1 year ago Hypothyroidism, unspecified type   [place] [facility] Vigg, Avanti, MD       Future Appointments             In 6 days Jon Billings, NP [place], [facility]            Signed Prescriptions Disp Refills   rosuvastatin (CRESTOR) 10 MG tablet 90 tablet 0    Sig: Take 1 tablet (10 mg total) by mouth daily.     Cardiovascular:  Antilipid - Statins 2 Failed - 08/24/2022  6:25 PM      Failed -  Lipid Panel in normal range within the last 12 months    Cholesterol, Total  Date Value Ref Range Status  02/09/2022 139 100 - 199 mg/dL Final   LDL Chol Calc (NIH)  Date Value Ref Range Status  02/09/2022 69 0 - 99 mg/dL Final   HDL  Date Value Ref Range Status  02/09/2022 40 >39 mg/dL Final   Triglycerides  Date Value Ref Range Status  02/09/2022 178 (H) 0 - 149 mg/dL Final         Passed - Cr in normal range and within 360 days    Creatinine, Ser  Date Value Ref Range Status  02/09/2022 1.14 0.76 - 1.27 mg/dL Final         Passed - Patient is not pregnant      Passed - Valid encounter within last 12 months    Recent Outpatient Visits           4 months ago Hordeolum externum of right upper eyelid   [place], NP   6 months ago Hyperlipidemia, unspecified hyperlipidemia type   [place] Kathrine Haddock, NP   1 year ago Primary hypertension   [place] Vigg, Avanti, MD   1 year ago Primary hypertension   [place] Vigg, Avanti, MD   1 year ago Hypothyroidism, unspecified type   [place] Vigg,  Avanti, MD       Future Appointments             In 6 days Jon Billings, NP [place], [facility]             albuterol (VENTOLIN HFA) 108 (90 Base) MCG/ACT inhaler 8.5 g 0    Sig: Inhale 2 puffs into the lungs every 6 (six) hours as needed for wheezing or shortness of breath.     Pulmonology:  Beta Agonists 2 Passed - 08/24/2022  6:25 PM      Passed - Last BP in normal range    BP Readings from Last 1 Encounters:  04/20/22 125/86         Passed - Last Heart Rate in normal range    Pulse Readings from Last 1 Encounters:  04/20/22 82         Passed - Valid encounter within last 12 months    Recent Outpatient Visits           4 months ago Hordeolum externum of right upper eyelid   [place], NP   6 months ago Hyperlipidemia, unspecified hyperlipidemia type   [place] Kathrine Haddock, NP   1 year ago Primary hypertension   [place] Vigg, Avanti, MD   1 year ago Primary hypertension   [place] Vigg, Avanti, MD   1 year ago Hypothyroidism, unspecified type   [place] Charlynne Cousins, MD       Future Appointments             In 6 days Jon Billings, NP [place], [facility]

## 2022-08-25 NOTE — Telephone Encounter (Signed)
Requested Prescriptions  Pending Prescriptions Disp Refills   meloxicam (MOBIC) 7.5 MG tablet [Pharmacy Med Name: MELOXICAM 7.5 MG TABLET] 90 tablet     Sig: Take 1 tablet (7.5 mg total) by mouth daily.     Analgesics:  COX2 Inhibitors Failed - 08/24/2022  6:25 PM      Failed - Manual Review: Labs are only required if the patient has taken medication for more than 8 weeks.      Failed - HGB in normal range and within 360 days    Hemoglobin  Date Value Ref Range Status  08/04/2021 18.0 (H) 13.0 - 17.7 g/dL Final         Failed - HCT in normal range and within 360 days    Hematocrit  Date Value Ref Range Status  08/04/2021 51.1 (H) 37.5 - 51.0 % Final         Passed - Cr in normal range and within 360 days    Creatinine, Ser  Date Value Ref Range Status  02/09/2022 1.14 0.76 - 1.27 mg/dL Final         Passed - AST in normal range and within 360 days    AST  Date Value Ref Range Status  02/09/2022 20 0 - 40 IU/L Final         Passed - ALT in normal range and within 360 days    ALT  Date Value Ref Range Status  02/09/2022 17 0 - 44 IU/L Final         Passed - eGFR is 30 or above and within 360 days    GFR, Estimated  Date Value Ref Range Status  08/26/2020 >60 >60 mL/min Final    Comment:    (NOTE) Calculated using the CKD-EPI Creatinine Equation (2021)    eGFR  Date Value Ref Range Status  02/09/2022 72 >59 mL/min/1.73 Final         Passed - Patient is not pregnant      Passed - Valid encounter within last 12 months    Recent Outpatient Visits           4 months ago Hordeolum externum of right upper eyelid   [place], NP   6 months ago Hyperlipidemia, unspecified hyperlipidemia type   [place] Kathrine Haddock, NP   1 year ago Primary hypertension   [place] Vigg, Avanti, MD   1 year ago Primary hypertension   [place] Vigg,  Avanti, MD   1 year ago Hypothyroidism, unspecified type   [place], Avanti, MD       Future Appointments             In 6 days Jon Billings, NP [place], [facility]             rosuvastatin (CRESTOR) 10 MG tablet [Pharmacy Med Name: ROSUVASTATIN CALCIUM 10 MG TAB] 90 tablet 0    Sig: Take 1 tablet (10 mg total) by mouth daily.     Cardiovascular:  Antilipid - Statins 2 Failed - 08/24/2022  6:25 PM      Failed - Lipid Panel in normal range within the last 12 months    Cholesterol, Total  Date Value Ref Range Status  02/09/2022 139 100 - 199 mg/dL Final   LDL Chol Calc (NIH)  Date Value Ref Range Status  02/09/2022 69 0 - 99 mg/dL  Final   HDL  Date Value Ref Range Status  02/09/2022 40 >39 mg/dL Final   Triglycerides  Date Value Ref Range Status  02/09/2022 178 (H) 0 - 149 mg/dL Final         Passed - Cr in normal range and within 360 days    Creatinine, Ser  Date Value Ref Range Status  02/09/2022 1.14 0.76 - 1.27 mg/dL Final         Passed - Patient is not pregnant      Passed - Valid encounter within last 12 months    Recent Outpatient Visits           4 months ago Hordeolum externum of right upper eyelid   [place], NP   6 months ago Hyperlipidemia, unspecified hyperlipidemia type   [place] Kathrine Haddock, NP   1 year ago Primary hypertension   [place] Vigg, Avanti, MD   1 year ago Primary hypertension   [place] Vigg, Avanti, MD   1 year ago Hypothyroidism, unspecified type   [place] Vigg, Avanti, MD       Future Appointments             In 6 days Jon Billings, NP [place], [facility]             albuterol (VENTOLIN HFA) 108 (90 Base) MCG/ACT inhaler [Pharmacy Med Name: ALBUTEROL HFA 90 MCG  INHALER] 8.5 g 0    Sig: Inhale 2 puffs into the lungs every 6 (six) hours as needed for wheezing or shortness of breath.     Pulmonology:  Beta Agonists 2 Passed - 08/24/2022  6:25 PM      Passed - Last BP in normal range    BP Readings from Last 1 Encounters:  04/20/22 125/86         Passed - Last Heart Rate in normal range    Pulse Readings from Last 1 Encounters:  04/20/22 82         Passed - Valid encounter within last 12 months    Recent Outpatient Visits           4 months ago Hordeolum externum of right upper eyelid   [place], NP   6 months ago Hyperlipidemia, unspecified hyperlipidemia type   [place] Kathrine Haddock, NP   1 year ago Primary hypertension   [place] Vigg, Avanti, MD   1 year ago Primary hypertension   [place] Vigg, Avanti, MD   1 year ago Hypothyroidism, unspecified type   [place] Charlynne Cousins, MD       Future Appointments             In 6 days Jon Billings, NP [place], [facility]            ]

## 2022-08-27 IMAGING — US US THYROID
1 series · 14 of 25 positions shown · non-contrast
Comparison: None.

CLINICAL DATA: Other.  Elevated TSH.

EXAM:
THYROID ULTRASOUND
TECHNIQUE: Ultrasound examination of the thyroid gland and adjacent soft
tissues was performed.

[Series 1: us thyroid · 14 of 33 slices shown]
[im 1/33]
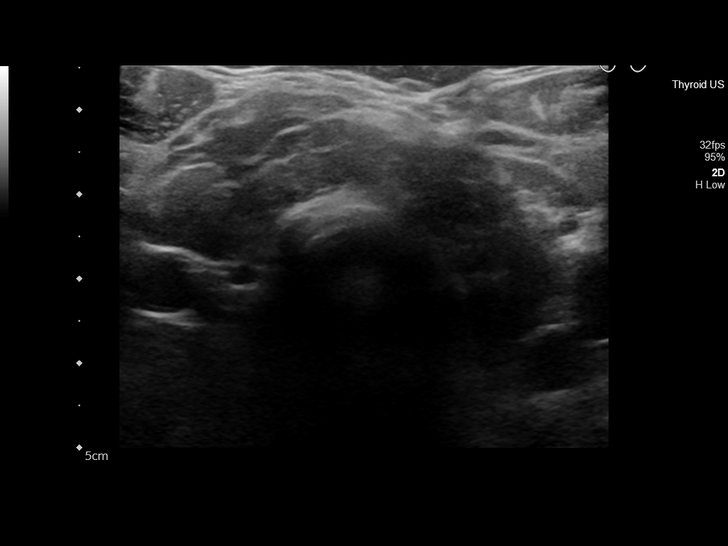
[im 3/33]
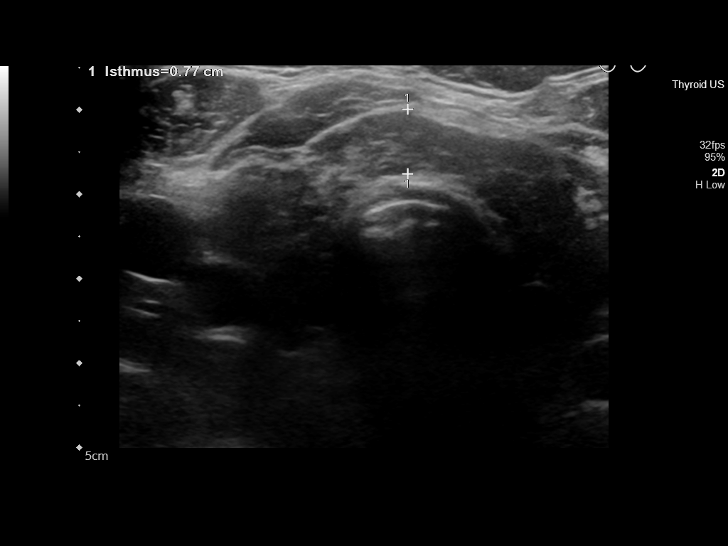
[im 6/33]
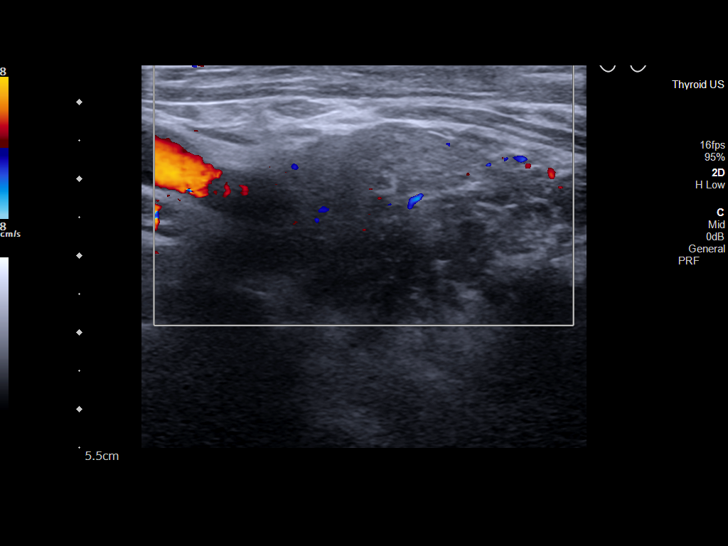
[im 9/33]
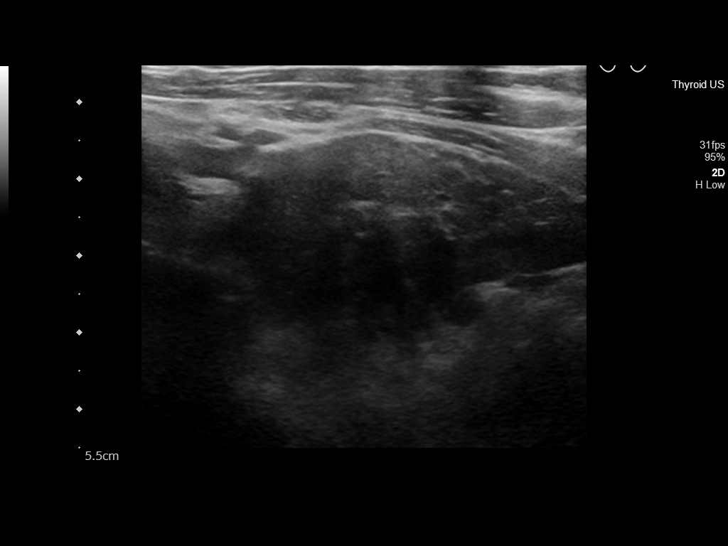
[im 11/33]
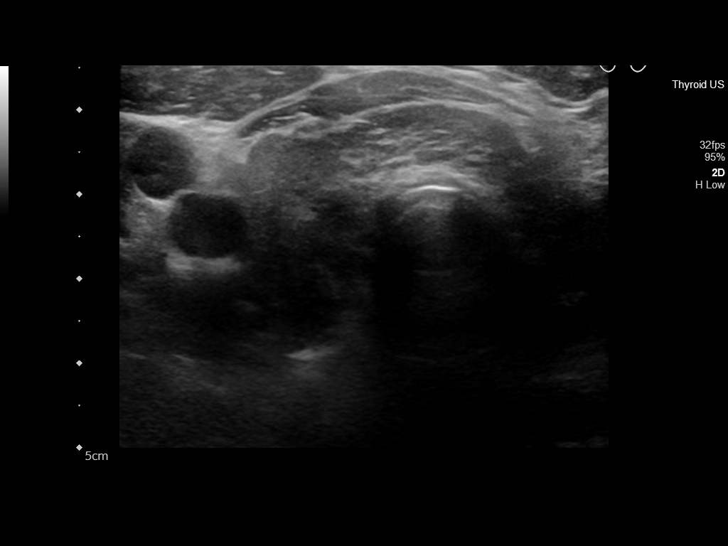
[im 13/33]
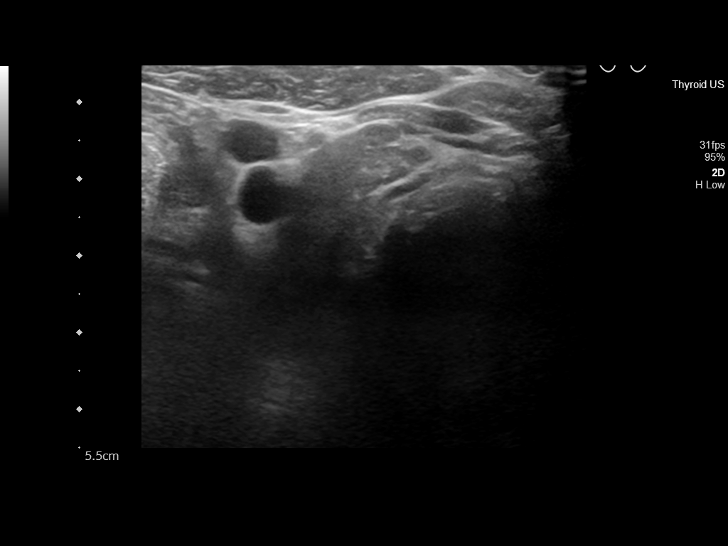
[im 15/33]
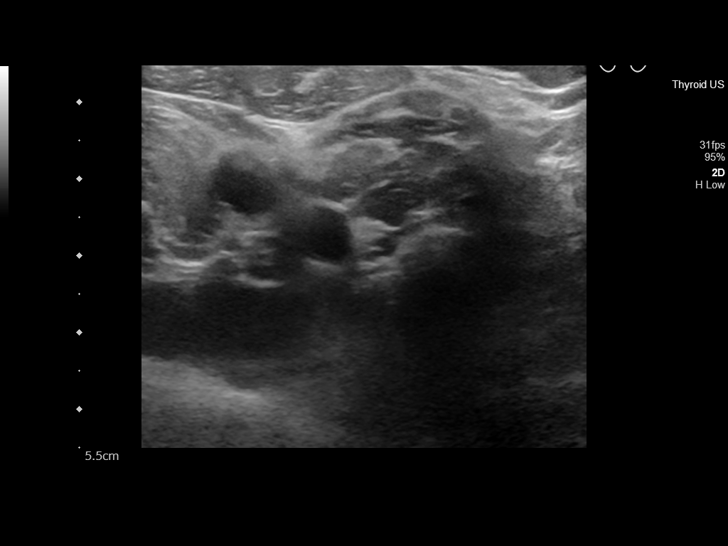
[im 18/33]
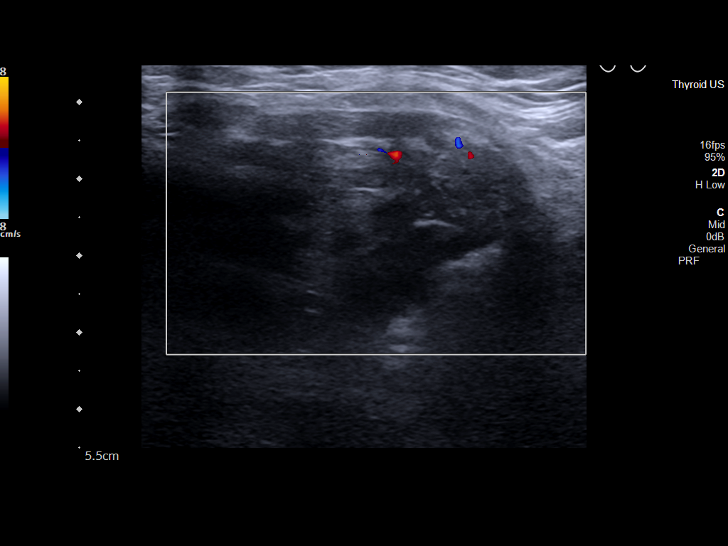
[im 21/33]
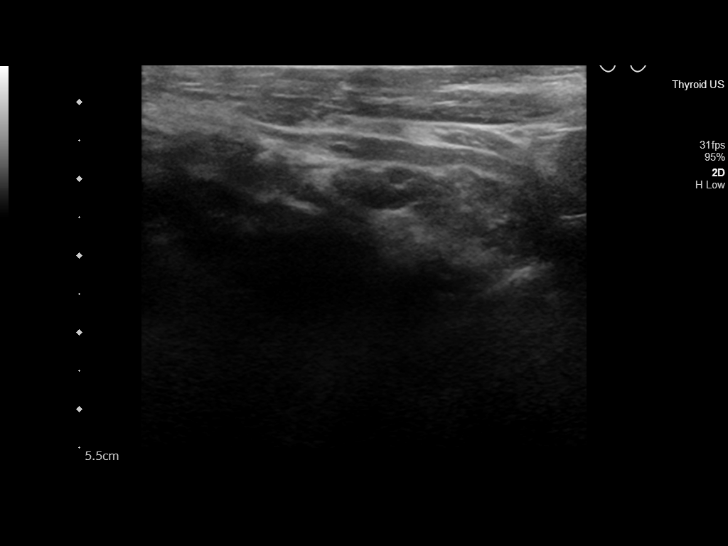
[im 22/33]
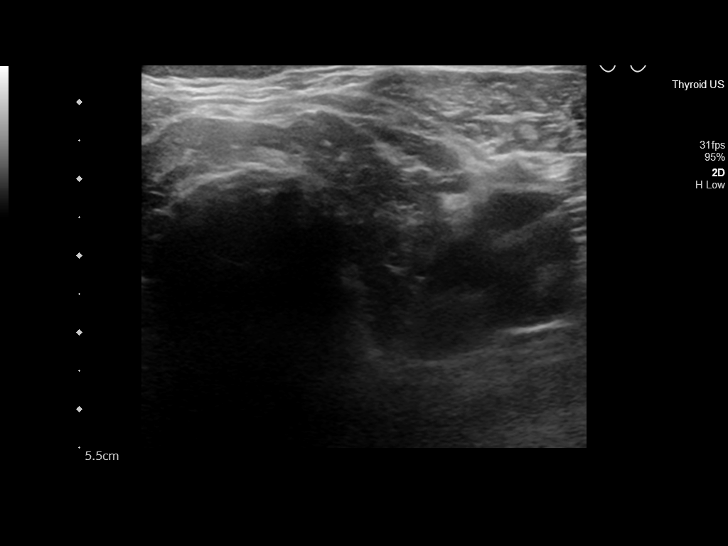
[im 25/33]
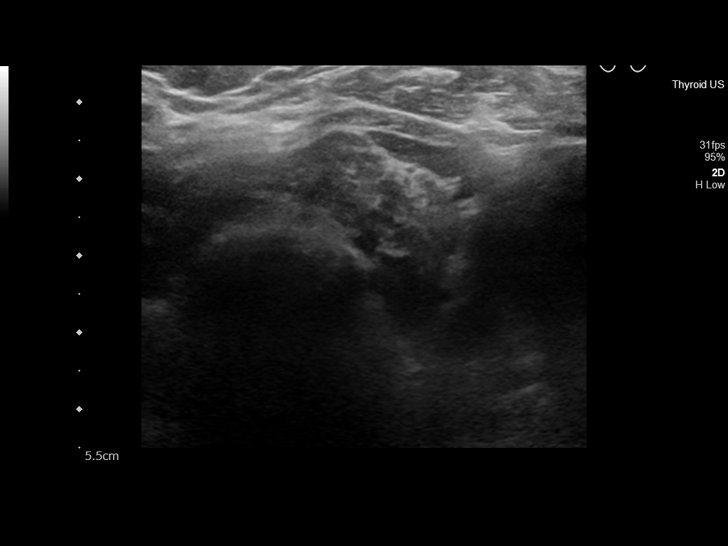
[im 27/33]
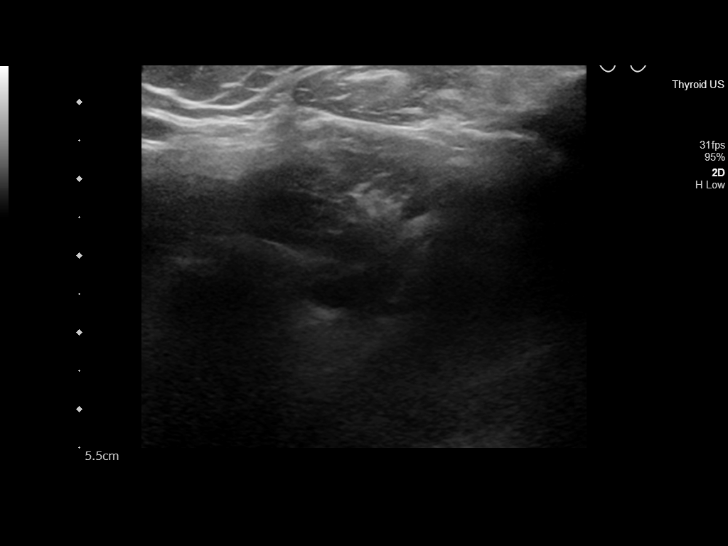
[im 30/33]
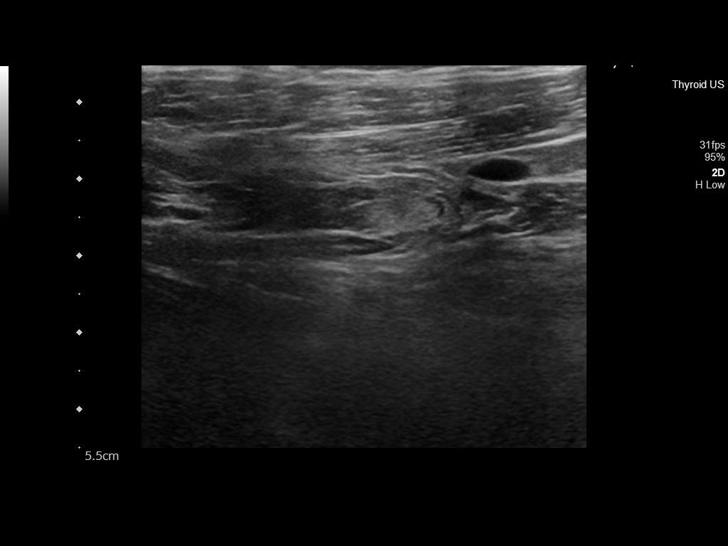
[im 33/33]
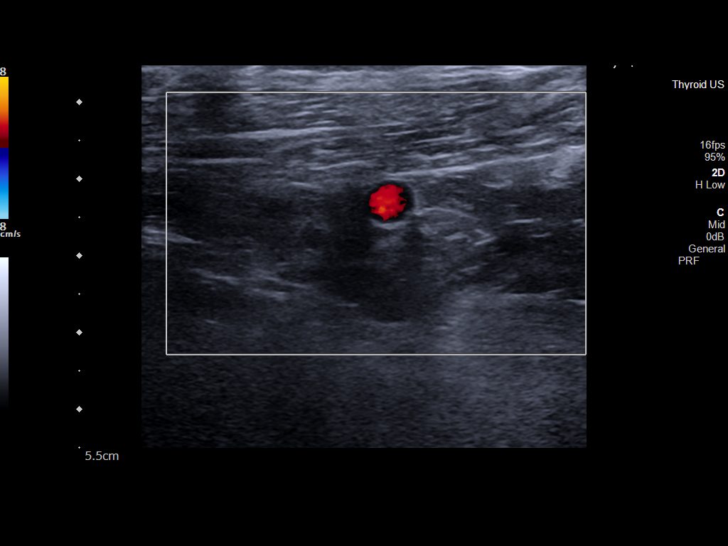

[14 of 25 positions shown; findings below may reference images not displayed]

FINDINGS: Parenchymal Echotexture: Markedly heterogenous - no definitive
glandular hyperemia

Isthmus: Normal in size measuring 0.7 cm in diameter

Right lobe: Normal in size measuring 5.1 x 2.8 x 1.6 cm

Left lobe: Slightly atrophic measuring 3.8 x 2.1 x 1.4 cm

_________________________________________________________

Estimated total number of nodules >/= 1 cm: 0

Number of spongiform nodules >/=  2 cm not described below (TR1): 0

Number of mixed cystic and solid nodules >/= 1.5 cm not described
below (TR2): 0

_________________________________________________________

No discrete nodules are seen within the thyroid gland.
IMPRESSION: Markedly heterogeneous and slightly atrophic thyroid without
discrete nodule mass. Findings are nonspecific though could be seen
in the setting of a chronic thyroiditis. Clinical correlation is
advised.

## 2022-08-31 ENCOUNTER — Ambulatory Visit: Payer: Medicare Other | Admitting: Nurse Practitioner

## 2022-08-31 DIAGNOSIS — I700 Atherosclerosis of aorta: Secondary | ICD-10-CM | POA: Insufficient documentation

## 2022-08-31 DIAGNOSIS — J439 Emphysema, unspecified: Secondary | ICD-10-CM | POA: Insufficient documentation

## 2022-08-31 NOTE — Progress Notes (Deleted)
There were no vitals taken for this visit.   Subjective:    Patient ID: Isaiah Brown, male    DOB: 07-09-1960, 62 y.o.   MRN: [id number]  HPI: Isaiah Brown is a 62 y.o. male  No chief complaint on file.  HYPERTENSION / HYPERLIPIDEMIA Satisfied with current treatment? {Blank single:19197::"yes","no"} Duration of hypertension: {Blank single:19197::"chronic","months","years"} BP monitoring frequency: {Blank single:19197::"not checking","rarely","daily","weekly","monthly","a few times a day","a few times a week","a few times a month"} BP range:  BP medication side effects: {Blank single:19197::"yes","no"} Past BP meds: {Blank A999333 (bystolic)","carvedilol","chlorthalidone","clonidine","diltiazem","exforge HCT","HCTZ","irbesartan (avapro)","labetalol","lisinopril","lisinopril-HCTZ","losartan (cozaar)","methyldopa","nifedipine","olmesartan (benicar)","olmesartan-HCTZ","quinapril","ramipril","spironalactone","tekturna","valsartan","valsartan-HCTZ","verapamil"} Duration of hyperlipidemia: {Blank single:19197::"chronic","months","years"} Cholesterol medication side effects: {Blank single:19197::"yes","no"} Cholesterol supplements: {Blank multiple:19196::"none","fish oil","niacin","red yeast rice"} Past cholesterol medications: {Blank multiple:19196::"none","atorvastain (lipitor)","lovastatin (mevacor)","pravastatin (pravachol)","rosuvastatin (crestor)","simvastatin (zocor)","vytorin","fenofibrate (tricor)","gemfibrozil","ezetimide (zetia)","niaspan","lovaza"} Medication compliance: {Blank single:19197::"excellent compliance","good compliance","fair compliance","poor compliance"} Aspirin: {Blank single:19197::"yes","no"} Recent stressors: {Blank single:19197::"yes","no"} Recurrent headaches: {Blank single:19197::"yes","no"} Visual changes: {Blank single:19197::"yes","no"} Palpitations:  {Blank single:19197::"yes","no"} Dyspnea: {Blank single:19197::"yes","no"} Chest pain: {Blank single:19197::"yes","no"} Lower extremity edema: {Blank single:19197::"yes","no"} Dizzy/lightheaded: {Blank single:19197::"yes","no"}  DIABETES Hypoglycemic episodes:{Blank single:19197::"yes","no"} Polydipsia/polyuria: {Blank single:19197::"yes","no"} Visual disturbance: {Blank single:19197::"yes","no"} Chest pain: {Blank single:19197::"yes","no"} Paresthesias: {Blank single:19197::"yes","no"} Glucose Monitoring: {Blank single:19197::"yes","no"}  Accucheck frequency: {Blank single:19197::"Not Checking","Daily","BID","TID"}  Fasting glucose:  Post prandial:  Evening:  Before meals: Taking Insulin?: {Blank single:19197::"yes","no"}  Long acting insulin:  Short acting insulin: Blood Pressure Monitoring: {Blank single:19197::"not checking","rarely","daily","weekly","monthly","a few times a day","a few times a week","a few times a month"} Retinal Examination: {Blank single:19197::"Up to Date","Not up to Date"} Foot Exam: {Blank single:19197::"Up to Date","Not up to Date"} Diabetic Education: {Blank single:19197::"Completed","Not Completed"} Pneumovax: {Blank single:19197::"Up to Date","Not up to Date","unknown"} Influenza: {Blank single:19197::"Up to Date","Not up to Date","unknown"} Aspirin: {Blank single:19197::"yes","no"}  Relevant past medical, surgical, family and social history reviewed and updated as indicated. Interim medical history since our last visit reviewed. Allergies and medications reviewed and updated.  Review of Systems  Per HPI unless specifically indicated above     Objective:    There were no vitals taken for this visit.  Wt Readings from Last 3 Encounters:  04/20/22 262 lb 8 oz (119.1 kg)  02/09/22 259 lb 9.6 oz (117.8 kg)  08/11/21 269 lb 9.6 oz (122.3 kg)    Physical Exam  Results for orders placed or performed in visit on 02/09/22  Comprehensive metabolic  panel  Result Value Ref Range   Glucose 78 70 - 99 mg/dL   BUN 12 8 - 27 mg/dL   Creatinine, Ser 1.14 0.76 - 1.27 mg/dL   eGFR 72 >59 mL/min/1.73   BUN/Creatinine Ratio 11 10 - 24   Sodium 143 134 - 144 mmol/L   Potassium 4.6 3.5 - 5.2 mmol/L   Chloride 103 96 - 106 mmol/L   CO2 26 20 - 29 mmol/L   Calcium 9.9 8.6 - 10.2 mg/dL   Total Protein 7.3 6.0 - 8.5 g/dL   Albumin 4.8 3.9 - 4.9 g/dL   Globulin, Total 2.5 1.5 - 4.5 g/dL   Albumin/Globulin Ratio 1.9 1.2 - 2.2   Bilirubin Total 0.5 0.0 - 1.2 mg/dL   Alkaline Phosphatase 59 44 - 121 IU/L   AST 20 0 - 40 IU/L   ALT 17 0 - 44 IU/L  Lipid Panel w/o Chol/HDL Ratio  Result Value Ref Range   Cholesterol, Total 139 100 - 199 mg/dL   Triglycerides 178 (H) 0 - 149 mg/dL   HDL 40 >39 mg/dL   VLDL Cholesterol Cal 30 5 - 40 mg/dL   LDL Chol Calc (NIH) 69 0 - 99 mg/dL  TSH  Result Value Ref Range   TSH 0.866 0.450 - 4.500 uIU/mL      Assessment & Plan:   Problem List Items Addressed This Visit       Cardiovascular and Mediastinum   Primary hypertension - Primary     Endocrine   Hypothyroidism     Other   Hyperlipidemia   Obesity (BMI 35.0-39.9 without comorbidity)     Follow up plan: No follow-ups on file.

## 2022-09-02 ENCOUNTER — Other Ambulatory Visit: Payer: Self-pay | Admitting: Nurse Practitioner

## 2022-09-02 NOTE — Telephone Encounter (Signed)
Unable to refill per protocol, Rx request is too soon. Last refill 08/25/22 for 90 days.  Requested Prescriptions  Pending Prescriptions Disp Refills   valsartan-hydrochlorothiazide (DIOVAN-HCT) 80-12.5 MG tablet [Pharmacy Med Name: VALSARTAN-HCTZ 80-12.5 MG TAB] 90 tablet 0    Sig: Take 1 tablet by mouth daily.     Cardiovascular: ARB + Diuretic Combos Failed - 09/02/2022 11:13 AM      Failed - K in normal range and within 180 days    Potassium  Date Value Ref Range Status  02/09/2022 4.6 3.5 - 5.2 mmol/L Final         Failed - Na in normal range and within 180 days    Sodium  Date Value Ref Range Status  02/09/2022 143 134 - 144 mmol/L Final         Failed - Cr in normal range and within 180 days    Creatinine, Ser  Date Value Ref Range Status  02/09/2022 1.14 0.76 - 1.27 mg/dL Final         Failed - eGFR is 10 or above and within 180 days    GFR, Estimated  Date Value Ref Range Status  08/26/2020 >60 >60 mL/min Final    Comment:    (NOTE) Calculated using the CKD-EPI Creatinine Equation (2021)    eGFR  Date Value Ref Range Status  02/09/2022 72 >59 mL/min/1.73 Final         Passed - Patient is not pregnant      Passed - Last BP in normal range    BP Readings from Last 1 Encounters:  04/20/22 125/86         Passed - Valid encounter within last 6 months    Recent Outpatient Visits           4 months ago Hordeolum externum of right upper eyelid   [place], NP   6 months ago Hyperlipidemia, unspecified hyperlipidemia type   [place] Kathrine Haddock, NP   1 year ago Primary hypertension   [place] Vigg, Avanti, MD   1 year ago Primary hypertension   [place] Vigg, Avanti, MD   1 year ago Hypothyroidism, unspecified type   [place] Charlynne Cousins, MD       Future Appointments             In 2 weeks  Jon Billings, NP [place], [facility]

## 2022-09-16 ENCOUNTER — Encounter: Payer: Self-pay | Admitting: Nurse Practitioner

## 2022-09-16 ENCOUNTER — Ambulatory Visit (INDEPENDENT_AMBULATORY_CARE_PROVIDER_SITE_OTHER): Payer: Medicare Other | Admitting: Nurse Practitioner

## 2022-09-16 VITALS — BP 144/93 | HR 66 | Temp 98.1°F | Wt 266.4 lb

## 2022-09-16 DIAGNOSIS — E039 Hypothyroidism, unspecified: Secondary | ICD-10-CM

## 2022-09-16 DIAGNOSIS — Z0000 Encounter for general adult medical examination without abnormal findings: Secondary | ICD-10-CM

## 2022-09-16 DIAGNOSIS — E669 Obesity, unspecified: Secondary | ICD-10-CM

## 2022-09-16 DIAGNOSIS — Z1159 Encounter for screening for other viral diseases: Secondary | ICD-10-CM | POA: Diagnosis not present

## 2022-09-16 DIAGNOSIS — I10 Essential (primary) hypertension: Secondary | ICD-10-CM | POA: Diagnosis not present

## 2022-09-16 DIAGNOSIS — Z1211 Encounter for screening for malignant neoplasm of colon: Secondary | ICD-10-CM

## 2022-09-16 DIAGNOSIS — J439 Emphysema, unspecified: Secondary | ICD-10-CM

## 2022-09-16 DIAGNOSIS — Z114 Encounter for screening for human immunodeficiency virus [HIV]: Secondary | ICD-10-CM

## 2022-09-16 DIAGNOSIS — Z6836 Body mass index (BMI) 36.0-36.9, adult: Secondary | ICD-10-CM

## 2022-09-16 DIAGNOSIS — D751 Secondary polycythemia: Secondary | ICD-10-CM | POA: Diagnosis not present

## 2022-09-16 DIAGNOSIS — R195 Other fecal abnormalities: Secondary | ICD-10-CM

## 2022-09-16 DIAGNOSIS — Z23 Encounter for immunization: Secondary | ICD-10-CM

## 2022-09-16 DIAGNOSIS — E785 Hyperlipidemia, unspecified: Secondary | ICD-10-CM

## 2022-09-16 DIAGNOSIS — Z7189 Other specified counseling: Secondary | ICD-10-CM | POA: Diagnosis not present

## 2022-09-16 DIAGNOSIS — I700 Atherosclerosis of aorta: Secondary | ICD-10-CM

## 2022-09-16 LAB — URINALYSIS, ROUTINE W REFLEX MICROSCOPIC
Bilirubin, UA: NEGATIVE
Glucose, UA: NEGATIVE
Ketones, UA: NEGATIVE
Leukocytes,UA: NEGATIVE
Nitrite, UA: NEGATIVE
Specific Gravity, UA: 1.02 (ref 1.005–1.030)
Urobilinogen, Ur: 0.2 mg/dL (ref 0.2–1.0)
pH, UA: 6 (ref 5.0–7.5)

## 2022-09-16 LAB — MICROSCOPIC EXAMINATION: Bacteria, UA: NONE SEEN

## 2022-09-16 MED ORDER — VALSARTAN-HYDROCHLOROTHIAZIDE 160-12.5 MG PO TABS: 1.0000 | ORAL_TABLET | Freq: Every day | ORAL | 1 refills | Status: DC

## 2022-09-16 MED ORDER — MELOXICAM 7.5 MG PO TABS: 7.5000 mg | ORAL_TABLET | Freq: Every day | ORAL | 2 refills | Status: DC

## 2022-09-16 NOTE — Assessment & Plan Note (Signed)
Labs ordered at visit today.  Will make recommendations based on lab results.   

## 2022-09-16 NOTE — Progress Notes (Signed)
NSR observed on EKG.  Results discussed with patient during visit.

## 2022-09-16 NOTE — Assessment & Plan Note (Signed)
Chronic.  Not well controlled.  Will increase Valsartan/ HCTZ. Side effects and benefits of medication discussed. DASH diet, smoking cessation, and exercise discussed. Follow up in 1 month.  Call sooner if concerns arise.

## 2022-09-16 NOTE — Progress Notes (Signed)
BP (!) 144/93 (BP Location: Right Arm, Cuff Size: Normal)   Pulse 66   Temp 98.1 F (36.7 C) (Oral)   Wt 266 lb 6.4 oz (120.8 kg)   SpO2 97%   BMI 36.08 kg/m    Subjective:    Patient ID: Isaiah Brown, male    DOB: 1960/09/24, 62 y.o.   MRN: [id number]  HPI: Isaiah Brown is a 62 y.o. male presenting on 09/16/2022 for comprehensive medical examination. Current medical complaints include:none  He currently lives with: Interim Problems from his last visit: no  HYPERTENSION / HYPERLIPIDEMIA Satisfied with current treatment? yes Duration of hypertension: years BP monitoring frequency: not checking BP range:  BP medication side effects: no Past BP meds: valsartan-HCTZ Duration of hyperlipidemia: years Cholesterol medication side effects: no Cholesterol supplements: none Past cholesterol medications: rosuvastatin (crestor) Medication compliance: excellent compliance Aspirin: no Recent stressors: no Recurrent headaches: no Visual changes: no Palpitations: no Dyspnea: no Chest pain: no Lower extremity edema: no Dizzy/lightheaded: no  HYPOTHYROIDISM Thyroid control status:controlled Satisfied with current treatment? yes Medication side effects: no Medication compliance: excellent compliance Etiology of hypothyroidism:  Recent dose adjustment:no Fatigue: no Cold intolerance: no Heat intolerance: no Weight gain: no Weight loss: no Constipation: no Diarrhea/loose stools: no Palpitations: no Lower extremity edema: no Anxiety/depressed mood: no  COPD COPD status: controlled Satisfied with current treatment?: yes Oxygen use: no Dyspnea frequency: when he exerts himself Cough frequency: no Rescue inhaler frequency:  a couple times a week Limitation of activity: yes Productive cough: yes- when he wakes up Last Spirometry:  Pneumovax: Up to Date Influenza: Up to Date   Functional Status Survey: Is the patient deaf or have difficulty hearing?: No Does the  patient have difficulty seeing, even when wearing glasses/contacts?: Yes Does the patient have difficulty concentrating, remembering, or making decisions?: No Does the patient have difficulty walking or climbing stairs?: Yes Does the patient have difficulty dressing or bathing?: No Does the patient have difficulty doing errands alone such as visiting a doctor's office or shopping?: No  FALL RISK:    09/16/2022    1:43 PM 04/20/2022   11:11 AM 02/09/2022   11:02 AM 08/11/2021   11:58 AM 06/02/2021   11:13 AM  Seltzer in the past year? 0 0 0 0 0  Number falls in past yr: 0 0 0 0 0  Injury with Fall? 0 0 0 0 0  Risk for fall due to : No Fall Risks No Fall Risks No Fall Risks No Fall Risks No Fall Risks  Follow up Falls evaluation completed Falls evaluation completed Falls evaluation completed Falls evaluation completed Falls evaluation completed    Depression Screen    04/20/2022   11:12 AM 02/09/2022   11:07 AM 08/11/2021   11:59 AM 06/02/2021   11:13 AM 04/28/2021   11:07 AM  Depression screen PHQ 2/9  Decreased Interest 0 0 0 0 0  Down, Depressed, Hopeless 0 0 0 0 0  PHQ - 2 Score 0 0 0 0 0  Altered sleeping 0 0 0 0 0  Tired, decreased energy 0 0 0 0 0  Change in appetite 0 0 0 0 0  Feeling bad or failure about yourself  0 0 0 0 0  Trouble concentrating 0 0 0 0 0  Moving slowly or fidgety/restless 0 0 0 0 0  Suicidal thoughts 0 0 0 0 0  PHQ-9 Score 0 0 0 0 0  Difficult doing work/chores Not  difficult at all Not difficult at all Not difficult at all  Not difficult at all    Advanced Directives Does patient have a HCPOA?    no If yes, name and contact information:  Does patient have a living will or MOST form?  no  Past Medical History:  Past Medical History:  Diagnosis Date   Anxiety    Chronic radicular lumbar pain    COPD (chronic obstructive pulmonary disease) ([place])    Hypertension    Sleep apnea    not using CPAP at this time    Surgical History:   Past Surgical History:  Procedure Laterality Date   APPENDECTOMY     CHOLECYSTECTOMY     CYSTOSCOPY WITH URETHRAL DILATATION N/A 01/12/2021   Procedure: CYSTOSCOPY WITH URETHRAL DILATATION WITH OPTILUME BALLOON;  Surgeon: Hollice Espy, MD;  Location: [facility] ORS;  Service: Urology;  Laterality: N/A;   HERNIA REPAIR     MOUTH SURGERY Left    Cyst on gum   SKIN CANCER EXCISION Left 2018   [place]    Medications:  Current Outpatient Medications on File Prior to Visit  Medication Sig   acetaminophen (TYLENOL) 500 MG tablet Take 2,000 mg by mouth every 6 (six) hours as needed for moderate pain.   albuterol (PROVENTIL) (2.5 MG/3ML) 0.083% nebulizer solution Take 3 mLs (2.5 mg total) by nebulization every 6 (six) hours as needed for wheezing or shortness of breath.   albuterol (VENTOLIN HFA) 108 (90 Base) MCG/ACT inhaler Inhale 2 puffs into the lungs every 6 (six) hours as needed for wheezing or shortness of breath.   levothyroxine (SYNTHROID) 150 MCG tablet Take 1 tablet (150 mcg total) by mouth daily.   Multiple Vitamins-Minerals (MULTIVITAMIN WITH MINERALS) tablet Take 1 tablet by mouth daily.   rosuvastatin (CRESTOR) 10 MG tablet Take 1 tablet (10 mg total) by mouth daily.   Tiotropium Bromide Monohydrate (SPIRIVA RESPIMAT) 2.5 MCG/ACT AERS Inhale 2 puffs into the lungs daily.   No current facility-administered medications on file prior to visit.    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   Drug use: Never   Sexual activity: Yes  Other Topics Concern   Not on file  Social History Narrative   Not on file   Social Determinants of Health   Financial Resource Strain: Not on file  Food Insecurity: Not on file  Transportation Needs: Not on file  Physical Activity: Not on file  Stress: Not on file  Social Connections: Not on file  Intimate Partner Violence: Not on file   Social History   Tobacco Use  Smoking Status Every Day   Packs/day: 1.00   Years: 46.00   Additional pack  years: 0.00   Total pack years: 46.00   Types: Cigars, Cigarettes  Smokeless Tobacco Never   Social History   Substance and Sexual Activity  Alcohol Use Not Currently   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   Review of Systems  Constitutional:  Negative for malaise/fatigue and weight loss.  Eyes:  Negative for blurred vision and double vision.  Respiratory:  Negative for shortness of breath.   Cardiovascular:  Negative for chest pain, palpitations and leg swelling.  Gastrointestinal:  Negative for constipation and diarrhea.  Neurological:  Negative for dizziness and headaches.  Psychiatric/Behavioral:  Negative for depression. The patient is not nervous/anxious.    All other ROS negative except what is listed above and in the HPI.      Objective:    BP (!) 144/93 (BP Location: Right Arm, Cuff Size: Normal)   Pulse 66   Temp 98.1 F (36.7 C) (Oral)   Wt 266 lb 6.4 oz (120.8 kg)   SpO2 97%   BMI 36.08 kg/m   Wt Readings from Last 3 Encounters:  09/16/22 266 lb 6.4 oz (120.8 kg)  04/20/22 262 lb 8 oz  (119.1 kg)  02/09/22 259 lb 9.6 oz (117.8 kg)    Hearing Screening   '500Hz'$  '1000Hz'$  '2000Hz'$  '4000Hz'$   Right ear '20 20 20 20  '$ Left ear '20 20 20 20   '$ Vision Screening   Right eye Left eye Both eyes  Without correction '20/50 20/50 20/30 '$  With correction       Physical Exam Vitals and nursing note reviewed.  Constitutional:      General: He is not in acute distress.    Appearance: Normal appearance. He is obese. He is not ill-appearing, toxic-appearing or diaphoretic.  HENT:     Head: Normocephalic.     Right Ear: Tympanic membrane, ear canal and external ear normal.     Left Ear: Tympanic membrane, ear canal and external ear normal.     Nose: Nose normal. No congestion or rhinorrhea.     Mouth/Throat:     Mouth: Mucous membranes are moist.  Eyes:     General:        Right eye: No discharge.        Left eye: No discharge.     Extraocular Movements: Extraocular movements intact.     Conjunctiva/sclera: Conjunctivae normal.     Pupils: Pupils are equal, round, and reactive to light.  Cardiovascular:     Rate and Rhythm: Normal rate and regular rhythm.     Heart sounds: No murmur heard. Pulmonary:     Effort: Pulmonary effort is normal. No respiratory distress.     Breath sounds: Normal breath sounds. No wheezing, rhonchi or rales.  Abdominal:     General: Abdomen is flat. Bowel sounds are normal. There is no distension.     Palpations: Abdomen is soft.     Tenderness: There is no abdominal tenderness. There is no guarding.  Musculoskeletal:     Cervical back: Normal range of motion and neck supple.  Skin:    General: Skin is warm and dry.     Capillary Refill: Capillary refill takes less than 2 seconds.  Neurological:     General: No focal  deficit present.     Mental Status: He is alert and oriented to person, place, and time.     Cranial Nerves: No cranial nerve deficit.     Motor: No weakness.     Deep Tendon Reflexes: Reflexes normal.  Psychiatric:        Mood and  Affect: Mood normal.        Behavior: Behavior normal.        Thought Content: Thought content normal.        Judgment: Judgment normal.         No data to display          Cognitive Testing - 6-CIT  Correct? Score   What year is it? yes 0 Yes = 0    No = 4  What month is it? yes 0 Yes = 0    No = 3  Remember:     [place], [place], [place]     What time is it? yes 0 Yes = 0    No = 3  Count backwards from 20 to 1 yes 0 Correct = 0    1 error = 2   More than 1 error = 4  Say the months of the year in reverse. yes 0 Correct = 0    1 error = 2   More than 1 error = 4  What address did I ask you to remember? yes 1 Correct = 0  1 error = 2    2 error = 4    3 error = 6    4 error = 8    All wrong = 10       TOTAL SCORE  1/28   Interpretation:  Normal  Normal (0-7) Abnormal (8-28)    Results for orders placed or performed in visit on 02/09/22  Comprehensive metabolic panel  Result Value Ref Range   Glucose 78 70 - 99 mg/dL   BUN 12 8 - 27 mg/dL   Creatinine, Ser 1.14 0.76 - 1.27 mg/dL   eGFR 72 >59 mL/min/1.73   BUN/Creatinine Ratio 11 10 - 24   Sodium 143 134 - 144 mmol/L   Potassium 4.6 3.5 - 5.2 mmol/L   Chloride 103 96 - 106 mmol/L   CO2 26 20 - 29 mmol/L   Calcium 9.9 8.6 - 10.2 mg/dL   Total Protein 7.3 6.0 - 8.5 g/dL   Albumin 4.8 3.9 - 4.9 g/dL   Globulin, Total 2.5 1.5 - 4.5 g/dL   Albumin/Globulin Ratio 1.9 1.2 - 2.2   Bilirubin Total 0.5 0.0 - 1.2 mg/dL   Alkaline Phosphatase 59 44 - 121 IU/L   AST 20 0 - 40 IU/L   ALT 17 0 - 44 IU/L  Lipid Panel w/o Chol/HDL Ratio  Result Value Ref Range   Cholesterol, Total 139 100 - 199 mg/dL   Triglycerides 178 (H) 0 - 149 mg/dL   HDL 40 >39 mg/dL   VLDL Cholesterol Cal 30 5 - 40 mg/dL   LDL Chol Calc (NIH) 69 0 - 99 mg/dL  TSH  Result Value Ref Range   TSH 0.866 0.450 - 4.500 uIU/mL      Assessment & Plan:   Problem List Items Addressed This Visit       Cardiovascular and Mediastinum   Primary  hypertension    Chronic.  Not well controlled.  Will increase Valsartan/ HCTZ. Side effects and benefits of medication discussed. DASH diet, smoking  cessation, and exercise discussed. Follow up in 1 month.  Call sooner if concerns arise.       Relevant Medications   valsartan-hydrochlorothiazide (DIOVAN-HCT) 160-12.5 MG tablet   Aortic atherosclerosis (HCC)    Chronic.  Controlled.  Continue with current medication regimen of Atorvastatin daily.  Labs ordered today.  Return to clinic in 6 months for reevaluation.  Call sooner if concerns arise.        Relevant Medications   valsartan-hydrochlorothiazide (DIOVAN-HCT) 160-12.5 MG tablet     Respiratory   Emphysema of lung (HCC)    Chronic.  Controlled.  Continue with current medication regimen of Spiriva.  Will get CT of Lung but wants to wait until next year.  Labs ordered today.  Return to clinic in 6 months for reevaluation.  Call sooner if concerns arise.          Endocrine   Hypothyroidism    Labs ordered at visit today.  Will make recommendations based on lab results.        Relevant Orders   TSH   T4, free     Other   Hyperlipidemia    Chronic.  Controlled.  Continue with current medication regimen of Crestor.  Labs ordered today.  Return to clinic in 6 months for reevaluation.  Call sooner if concerns arise.        Relevant Medications   valsartan-hydrochlorothiazide (DIOVAN-HCT) 160-12.5 MG tablet   Other Relevant Orders   Lipid panel   Obesity (BMI 35.0-39.9 without comorbidity)    Recommended eating smaller high protein, low fat meals more frequently and exercising 30 mins a day 5 times a week with a goal of 10-15lb weight loss in the next 3 months.        Advance care planning    A voluntary discussion about advance care planning including the explanation and discussion of advance directives was extensively discussed  with the patient for 10 minutes with patient.  Explanation about the health care proxy and  Living will was reviewed and packet with forms with explanation of how to fill them out was given.  During this discussion, the patient plans to fill out the paperwork required.  Patient was offered a separate [place] visit for further assistance with forms.         Other Visit Diagnoses     Welcome to Medicare preventive visit    -  Primary   Relevant Orders   EKG 12-Lead (Completed)   [place] [employer] MEDICARE SCREENING   Annual physical exam       Health maintenance reviewed during visit.  Labs ordered today.  Vaccines up to date.  Cologuard ordered.   Relevant Orders   TSH   PSA   CBC with Differential/Platelet   Comprehensive metabolic panel   Urinalysis, Routine w reflex microscopic   Encounter for hepatitis C screening test for low risk patient       Relevant Orders   Hepatitis C Antibody   Screening for HIV (human immunodeficiency virus)       Relevant Orders   HIV Antibody (routine testing w rflx)   Need for vaccination against Streptococcus pneumoniae       Relevant Orders   Pneumococcal conjugate vaccine 20-valent (Prevnar 20)   Screening for colon cancer       Relevant Orders   Cologuard        Preventative Services:  Health Risk Assessment and Personalized Prevention Plan: Bone Mass Measurements: CVD Screening:  Colon  Cancer Screening:  Depression Screening:  Diabetes Screening:  Glaucoma Screening:  Hepatitis B vaccine: Hepatitis C screening:  HIV Screening: Flu Vaccine: Lung cancer Screening: Obesity Screening:  Pneumonia Vaccines (2): STI Screening: PSA screening:  Discussed aspirin prophylaxis for myocardial infarction prevention and decision was it was not indicated  LABORATORY TESTING:  Health maintenance labs ordered today as discussed above.   The natural history of prostate cancer and ongoing controversy regarding screening and potential treatment outcomes of prostate cancer has been discussed with the patient. The meaning of a  false positive PSA and a false negative PSA has been discussed. He indicates understanding of the limitations of this screening test and wishes to proceed with screening PSA testing.   IMMUNIZATIONS:   - Tdap: Tetanus vaccination status reviewed: On Medicare. - Influenza: Up to date - Pneumovax: Administered today - Prevnar: Up to date - Zostavax vaccine:  Needs second shot- will have to get at the pharmacy  SCREENING: - Colonoscopy:  Ordered Cologuard   Discussed with patient purpose of the colonoscopy is to detect colon cancer at curable precancerous or early stages   - AAA Screening: Ordered today  -Hearing Test: Not applicable  -Spirometry: Not applicable   PATIENT COUNSELING:    Sexuality: Discussed sexually transmitted diseases, partner selection, use of condoms, avoidance of unintended pregnancy  and contraceptive alternatives.   Advised to avoid cigarette smoking.  I discussed with the patient that most people either abstain from alcohol or drink within safe limits (<=14/week and <=4 drinks/occasion for males, <=7/weeks and <= 3 drinks/occasion for females) and that the risk for alcohol disorders and other health effects rises proportionally with the number of drinks per week and how often a drinker exceeds daily limits.  Discussed cessation/primary prevention of drug use and availability of treatment for abuse.   Diet: Encouraged to adjust caloric intake to maintain  or achieve ideal body weight, to reduce intake of dietary saturated fat and total fat, to limit sodium intake by avoiding high sodium foods and not adding table salt, and to maintain adequate dietary potassium and calcium preferably from fresh fruits, vegetables, and low-fat dairy products.    stressed the importance of regular exercise  Injury prevention: Discussed safety belts, safety helmets, smoke detector, smoking near bedding or upholstery.   Dental health: Discussed importance of regular tooth brushing,  flossing, and dental visits.   Follow up plan: NEXT PREVENTATIVE PHYSICAL DUE IN 1 YEAR. Return in about 1 month (around 10/17/2022) for BP Check.

## 2022-09-16 NOTE — Assessment & Plan Note (Signed)
Chronic.  Controlled.  Continue with current medication regimen of Spiriva.  Will get CT of Lung but wants to wait until next year.  Labs ordered today.  Return to clinic in 6 months for reevaluation.  Call sooner if concerns arise.

## 2022-09-16 NOTE — Assessment & Plan Note (Signed)
Recommended eating smaller high protein, low fat meals more frequently and exercising 30 mins a day 5 times a week with a goal of 10-15lb weight loss in the next 3 months.  

## 2022-09-16 NOTE — Assessment & Plan Note (Signed)
Chronic.  Controlled.  Continue with current medication regimen of Atorvastatin daily.  Labs ordered today.  Return to clinic in 6 months for reevaluation.  Call sooner if concerns arise.   

## 2022-09-16 NOTE — Assessment & Plan Note (Signed)
Chronic.  Controlled.  Continue with current medication regimen of Crestor.  Labs ordered today.  Return to clinic in 6 months for reevaluation.  Call sooner if concerns arise.   

## 2022-09-16 NOTE — Assessment & Plan Note (Signed)
A voluntary discussion about advance care planning including the explanation and discussion of advance directives was extensively discussed  with the patient for 10 minutes with patient.  Explanation about the health care proxy and Living will was reviewed and packet with forms with explanation of how to fill them out was given.  During this discussion, the patient plans to fill out the paperwork required.  Patient was offered a separate [place] visit for further assistance with forms.

## 2022-09-17 LAB — LIPID PANEL
Chol/HDL Ratio: 3.8 ratio (ref 0.0–5.0)
Cholesterol, Total: 167 mg/dL (ref 100–199)
HDL: 44 mg/dL (ref >39)
LDL Chol Calc (NIH): 99 mg/dL (ref 0–99)
Triglycerides: 137 mg/dL (ref 0–149)
VLDL Cholesterol Cal: 24 mg/dL (ref 5–40)

## 2022-09-17 LAB — COMPREHENSIVE METABOLIC PANEL
ALT: 16 IU/L (ref 0–44)
AST: 20 IU/L (ref 0–40)
Albumin/Globulin Ratio: 2.1 (ref 1.2–2.2)
Albumin: 4.7 g/dL (ref 3.9–4.9)
Alkaline Phosphatase: 65 IU/L (ref 44–121)
BUN/Creatinine Ratio: 15 (ref 10–24)
BUN: 17 mg/dL (ref 8–27)
Bilirubin Total: 0.4 mg/dL (ref 0.0–1.2)
CO2: 21 mmol/L (ref 20–29)
Calcium: 9.1 mg/dL (ref 8.6–10.2)
Chloride: 106 mmol/L (ref 96–106)
Creatinine, Ser: 1.13 mg/dL (ref 0.76–1.27)
Globulin, Total: 2.2 g/dL (ref 1.5–4.5)
Glucose: 89 mg/dL (ref 70–99)
Potassium: 4 mmol/L (ref 3.5–5.2)
Sodium: 143 mmol/L (ref 134–144)
Total Protein: 6.9 g/dL (ref 6.0–8.5)
eGFR: 73 mL/min/{1.73_m2} (ref >59)

## 2022-09-17 LAB — CBC WITH DIFFERENTIAL/PLATELET
Basophils Absolute: 0.1 10*3/uL (ref 0.0–0.2)
Basos: 1 %
EOS (ABSOLUTE): 0.2 10*3/uL (ref 0.0–0.4)
Eos: 2 %
Hematocrit: 55.2 % — ABNORMAL HIGH (ref 37.5–51.0)
Hemoglobin: 18.7 g/dL — ABNORMAL HIGH (ref 13.0–17.7)
Immature Grans (Abs): 0 10*3/uL (ref 0.0–0.1)
Immature Granulocytes: 0 %
Lymphocytes Absolute: 1.8 10*3/uL (ref 0.7–3.1)
Lymphs: 23 %
MCH: 31.1 pg (ref 26.6–33.0)
MCHC: 33.9 g/dL (ref 31.5–35.7)
MCV: 92 fL (ref 79–97)
Monocytes Absolute: 0.7 10*3/uL (ref 0.1–0.9)
Monocytes: 9 %
Neutrophils Absolute: 4.9 10*3/uL (ref 1.4–7.0)
Neutrophils: 65 %
Platelets: 170 10*3/uL (ref 150–450)
RBC: 6.02 x10E6/uL — ABNORMAL HIGH (ref 4.14–5.80)
RDW: 13.1 % (ref 11.6–15.4)
WBC: 7.6 10*3/uL (ref 3.4–10.8)

## 2022-09-17 LAB — PSA: Prostate Specific Ag, Serum: 0.6 ng/mL (ref 0.0–4.0)

## 2022-09-17 LAB — HEPATITIS C ANTIBODY: Hep C Virus Ab: NONREACTIVE

## 2022-09-17 LAB — T4, FREE: Free T4: 1.6 ng/dL (ref 0.82–1.77)

## 2022-09-17 LAB — TSH: TSH: 6.05 u[IU]/mL — ABNORMAL HIGH (ref 0.450–4.500)

## 2022-09-17 LAB — HIV ANTIBODY (ROUTINE TESTING W REFLEX): HIV Screen 4th Generation wRfx: NONREACTIVE

## 2022-09-17 NOTE — Addendum Note (Signed)
Addended by: Jon Billings on: 09/17/2022 11:48 AM   Modules accepted: Orders

## 2022-09-17 NOTE — Progress Notes (Signed)
Referral placed.

## 2022-09-17 NOTE — Progress Notes (Signed)
Please let patient know that his lab work shows that his thyroid labs show that he should continue with his current dose of levothyroxine.    His Complete blood count is elevated.  I recommend that he see Hematology for further evaluation.  I suspect that his blood counts are elevated due to smoking however, we should evaluate this further.    Otherwise, his lab work looks good.  No other concerns at this time.  Follow up as discussed.

## 2022-09-21 ENCOUNTER — Telehealth: Payer: Self-pay | Admitting: Internal Medicine

## 2022-09-21 ENCOUNTER — Inpatient Hospital Stay: Payer: Medicare Other

## 2022-09-21 ENCOUNTER — Other Ambulatory Visit: Payer: Self-pay

## 2022-09-21 ENCOUNTER — Inpatient Hospital Stay: Payer: Medicare Other | Attending: Internal Medicine | Admitting: Internal Medicine

## 2022-09-21 ENCOUNTER — Encounter: Payer: Self-pay | Admitting: Internal Medicine

## 2022-09-21 VITALS — BP 131/89 | HR 91 | Temp 98.6°F | Ht 73.0 in | Wt 265.3 lb

## 2022-09-21 DIAGNOSIS — D751 Secondary polycythemia: Secondary | ICD-10-CM | POA: Insufficient documentation

## 2022-09-21 DIAGNOSIS — Z79899 Other long term (current) drug therapy: Secondary | ICD-10-CM | POA: Insufficient documentation

## 2022-09-21 DIAGNOSIS — F17210 Nicotine dependence, cigarettes, uncomplicated: Secondary | ICD-10-CM | POA: Insufficient documentation

## 2022-09-21 DIAGNOSIS — J449 Chronic obstructive pulmonary disease, unspecified: Secondary | ICD-10-CM | POA: Insufficient documentation

## 2022-09-21 LAB — CBC WITH DIFFERENTIAL/PLATELET
Abs Immature Granulocytes: 0.06 10*3/uL (ref 0.00–0.07)
Basophils Absolute: 0.1 10*3/uL (ref 0.0–0.1)
Basophils Relative: 1 %
Eosinophils Absolute: 0.1 10*3/uL (ref 0.0–0.5)
Eosinophils Relative: 1 %
HCT: 57.2 % — ABNORMAL HIGH (ref 39.0–52.0)
Hemoglobin: 19.2 g/dL — ABNORMAL HIGH (ref 13.0–17.0)
Immature Granulocytes: 1 %
Lymphocytes Relative: 12 %
Lymphs Abs: 1.3 10*3/uL (ref 0.7–4.0)
MCH: 31.3 pg (ref 26.0–34.0)
MCHC: 33.6 g/dL (ref 30.0–36.0)
MCV: 93.2 fL (ref 80.0–100.0)
Monocytes Absolute: 1 10*3/uL (ref 0.1–1.0)
Monocytes Relative: 9 %
Neutro Abs: 8.2 10*3/uL — ABNORMAL HIGH (ref 1.7–7.7)
Neutrophils Relative %: 76 %
Platelets: 179 10*3/uL (ref 150–400)
RBC: 6.14 MIL/uL — ABNORMAL HIGH (ref 4.22–5.81)
RDW: 13.3 % (ref 11.5–15.5)
WBC: 10.7 10*3/uL — ABNORMAL HIGH (ref 4.0–10.5)
nRBC: 0 % (ref 0.0–0.2)

## 2022-09-21 LAB — COMPREHENSIVE METABOLIC PANEL
ALT: 15 U/L (ref 0–44)
AST: 22 U/L (ref 15–41)
Albumin: 4.5 g/dL (ref 3.5–5.0)
Alkaline Phosphatase: 47 U/L (ref 38–126)
Anion gap: 8 (ref 5–15)
BUN: 19 mg/dL (ref 8–23)
CO2: 25 mmol/L (ref 22–32)
Calcium: 9.3 mg/dL (ref 8.9–10.3)
Chloride: 105 mmol/L (ref 98–111)
Creatinine, Ser: 1.06 mg/dL (ref 0.61–1.24)
GFR, Estimated: >60 mL/min (ref >60)
Glucose, Bld: 102 mg/dL — ABNORMAL HIGH (ref 70–99)
Potassium: 3.7 mmol/L (ref 3.5–5.1)
Sodium: 138 mmol/L (ref 135–145)
Total Bilirubin: 0.7 mg/dL (ref 0.3–1.2)
Total Protein: 7.9 g/dL (ref 6.5–8.1)

## 2022-09-21 LAB — LACTATE DEHYDROGENASE: LDH: 117 U/L (ref 98–192)

## 2022-09-21 NOTE — Assessment & Plan Note (Addendum)
#  Erythrocytosis-asymptomatic.  Hematocrit interval between 18-19.  I had a long discussion the patient regarding the potential causes of abnormal blood counts-both primary bone marrow problem versus reactive/secondary causes.    # For now I  recommend checking CBC;CMP jak 2; LDH on the peripheral blood; Also discussed regarding bone marrow biopsy with the above workup is inconclusive; however I would prefer not to do a bone marrow unless absolutely needed.   #I discussed the role of phlebotomy in bringing the hematocrit/hemoglobin down-avoid any thromboembolic events.  The role of phlebotomy in secondary erythrocytosis is unclear/controversial.  However, phlebotomy is a standard of care [goal hematocrit less than 45] for polycythemia vera.  Await workup from today.   # Smoker- Discussed with the patient regarding the ill effects of smoking- including but not limited to cardiac lung and vascular diseases and malignancies. Counseled against smoking.   # Discussed regarding lung cancer screening program at length; which includes low-dose CT scan on annual basis for 5 years.  Based upon the findings patient would be recommended surveillance/biopsies/or surgery.  Lung cancer program has shown to save lives by early detection of lung cancer.   Last CT scan 2022- recommend follow up with PCP re: resuming lung cancer screening.   # DISPOSITION: # labs today-  # follow up in appx 3 weeks- MD; no labs- possible phlebotomy- dr.B.   Thank you Ms. Holdsworth for allowing me to participate in the care of your pleasant patient. Please do not hesitate to contact me with questions or concerns in the interim.

## 2022-09-21 NOTE — Progress Notes (Signed)
New patient evaluation with no acute concerns.

## 2022-09-21 NOTE — Progress Notes (Signed)
[name] NOTE  Patient Care Team: Jon Billings, NP as PCP - General (Nurse Practitioner) Kate Sable, MD as PCP - Cardiology (Cardiology)  CHIEF COMPLAINTS/PURPOSE OF CONSULTATION: ERYTHROCYTOSIS  HEMATOLOGY HISTORY  # ERYTHROCYTOSIS  [Hb; WBC; platelets]  HISTORY OF PRESENTING ILLNESS: Patient ambulating-independently. Alone.   Isaiah Brown 62 y.o.  male pleasant patient was been referred to [place] for further evaluation of erythrocytosis incidentally noted on routine blood work.  Patient admits to mild fatigue not significantly worse.   Smoking: active smoker; history of mild COPD.  OSA/CPAP:previous sleep study- ? Mild  Hx of DVT/PE or Stroke: none  Testosterone: none    Review of Systems  Constitutional:  Positive for malaise/fatigue. Negative for chills, diaphoresis, fever and weight loss.  HENT:  Negative for nosebleeds and sore throat.   Eyes:  Negative for double vision.  Respiratory:  Negative for cough, hemoptysis, sputum production, shortness of breath and wheezing.   Cardiovascular:  Negative for chest pain, palpitations, orthopnea and leg swelling.  Gastrointestinal:  Negative for abdominal pain, blood in stool, constipation, diarrhea, heartburn, melena, nausea and vomiting.  Genitourinary:  Negative for dysuria, frequency and urgency.  Musculoskeletal:  Positive for back pain and joint pain.  Skin: Negative.  Negative for itching and rash.  Neurological:  Negative for dizziness, tingling, focal weakness, weakness and headaches.  Endo/Heme/Allergies:  Does not bruise/bleed easily.  Psychiatric/Behavioral:  Negative for depression. The patient is not nervous/anxious and does not have insomnia.      MEDICAL HISTORY:  Past Medical History:  Diagnosis Date   Anxiety    Aortic atherosclerosis (HCC)    Chronic radicular lumbar pain    COPD (chronic obstructive pulmonary disease) ([place])    Hyperlipidemia    Hypertension     Hypothyroidism    Sleep apnea    not using CPAP at this time    SURGICAL HISTORY: Past Surgical History:  Procedure Laterality Date   APPENDECTOMY     CHOLECYSTECTOMY     CYSTOSCOPY WITH URETHRAL DILATATION N/A 01/12/2021   Procedure: CYSTOSCOPY WITH URETHRAL DILATATION WITH OPTILUME BALLOON;  Surgeon: Hollice Espy, MD;  Location: [facility] ORS;  Service: Urology;  Laterality: N/A;   HERNIA REPAIR     MOUTH SURGERY Left    Cyst on gum   SKIN CANCER EXCISION Left 2018   [place]    SOCIAL HISTORY: Social History   Socioeconomic History   Marital status: Single    Spouse name: Not on file   Number of children: Not on file   Years of education: Not on file   Highest education level: Not on file  Occupational History   Not on file  Tobacco Use   Smoking status: Every Day    Packs/day: 1.00    Years: 46.00    Additional pack years: 0.00    Total pack years: 46.00    Types: Cigars, Cigarettes   Smokeless tobacco: Never  Vaping Use   Vaping Use: Former  Substance and Sexual Activity   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   Drug use: Never   Sexual activity: Yes  Other Topics Concern   Not on file  Social History Narrative   Not on file   Social Determinants of Health   Financial Resource Strain: Not on file  Food Insecurity: No Food Insecurity (09/21/2022)   Hunger Vital Sign    Worried About Running Out of Food in the Last Year: Never  true    Ran Out of Food in the Last Year: Never true  Transportation Needs: No Transportation Needs (09/21/2022)   PRAPARE - Hydrologist (Medical): No    Lack of Transportation (Non-Medical): No  Physical Activity: Not on file  Stress: Not on file  Social Connections: Not on file  Intimate Partner Violence: Not At Risk (09/21/2022)   Humiliation, Afraid, Rape, and Kick questionnaire    Fear of Current or Ex-Partner: No    Emotionally Abused: No    Physically Abused: No     Sexually Abused: No    FAMILY HISTORY: Family History  Adopted: Yes  Family history unknown: Yes    ALLERGIES:  is allergic to other.  MEDICATIONS:  Current Outpatient Medications  Medication Sig Dispense Refill   acetaminophen (TYLENOL) 500 MG tablet Take 2,000 mg by mouth every 6 (six) hours as needed for moderate pain.     albuterol (PROVENTIL) (2.5 MG/3ML) 0.083% nebulizer solution Take 3 mLs (2.5 mg total) by nebulization every 6 (six) hours as needed for wheezing or shortness of breath. 75 mL 12   albuterol (VENTOLIN HFA) 108 (90 Base) MCG/ACT inhaler Inhale 2 puffs into the lungs every 6 (six) hours as needed for wheezing or shortness of breath. 8.5 g 0   levothyroxine (SYNTHROID) 150 MCG tablet Take 1 tablet (150 mcg total) by mouth daily. 90 tablet 1   meloxicam (MOBIC) 7.5 MG tablet Take 1 tablet (7.5 mg total) by mouth daily. 30 tablet 2   Multiple Vitamins-Minerals (MULTIVITAMIN WITH MINERALS) tablet Take 1 tablet by mouth daily.     rosuvastatin (CRESTOR) 10 MG tablet Take 1 tablet (10 mg total) by mouth daily. 90 tablet 0   Tiotropium Bromide Monohydrate (SPIRIVA RESPIMAT) 2.5 MCG/ACT AERS Inhale 2 puffs into the lungs daily. 1 each 6   valsartan-hydrochlorothiazide (DIOVAN-HCT) 160-12.5 MG tablet Take 1 tablet by mouth daily. 90 tablet 1   No current facility-administered medications for this visit.     PHYSICAL EXAMINATION:   Vitals:   09/21/22 1400  BP: 131/89  Pulse: 91  Temp: 98.6 F (37 C)   Filed Weights   09/21/22 1400  Weight: 265 lb 4.8 oz (120.3 kg)    Physical Exam Vitals and nursing note reviewed.  HENT:     Head: Normocephalic and atraumatic.     Mouth/Throat:     Pharynx: Oropharynx is clear.  Eyes:     Extraocular Movements: Extraocular movements intact.     Pupils: Pupils are equal, round, and reactive to light.  Cardiovascular:     Rate and Rhythm: Normal rate and regular rhythm.  Pulmonary:     Comments: Decreased breath sounds  bilaterally.  Abdominal:     Palpations: Abdomen is soft.  Musculoskeletal:        General: Normal range of motion.     Cervical back: Normal range of motion.  Skin:    General: Skin is warm.  Neurological:     General: No focal deficit present.     Mental Status: He is alert and oriented to person, place, and time.  Psychiatric:        Behavior: Behavior normal.        Judgment: Judgment normal.      LABORATORY DATA:  I have reviewed the data as listed Lab Results  Component Value Date   WBC 10.7 (H) 09/21/2022   HGB 19.2 (H) 09/21/2022   HCT 57.2 (H) 09/21/2022   MCV  93.2 09/21/2022   PLT 179 09/21/2022   Recent Labs    02/09/22 1200 09/16/22 1449 09/21/22 1449  NA 143 143 138  K 4.6 4.0 3.7  CL 103 106 105  CO2 26 21 25   GLUCOSE 78 89 102*  BUN 12 17 19   CREATININE 1.14 1.13 1.06  CALCIUM 9.9 9.1 9.3  GFRNONAA  --   --  >60  PROT 7.3 6.9 7.9  ALBUMIN 4.8 4.7 4.5  AST 20 20 22   ALT 17 16 15   ALKPHOS 59 65 47  BILITOT 0.5 0.4 0.7     No results found.  ASSESSMENT & PLAN:   Erythrocytosis #Erythrocytosis-asymptomatic.  Hematocrit interval between 18-19.  I had a long discussion the patient regarding the potential causes of abnormal blood counts-both primary bone marrow problem versus reactive/secondary causes.    # For now I  recommend checking CBC;CMP jak 2; LDH on the peripheral blood; Also discussed regarding bone marrow biopsy with the above workup is inconclusive; however I would prefer not to do a bone marrow unless absolutely needed.   #I discussed the role of phlebotomy in bringing the hematocrit/hemoglobin down-avoid any thromboembolic events.  The role of phlebotomy in secondary erythrocytosis is unclear/controversial.  However, phlebotomy is a standard of care [goal hematocrit less than 45] for polycythemia vera.  Await workup from today.   # Smoker- Discussed with the patient regarding the ill effects of smoking- including but not limited to  cardiac lung and vascular diseases and malignancies. Counseled against smoking.   # Discussed regarding lung cancer screening program at length; which includes low-dose CT scan on annual basis for 5 years.  Based upon the findings patient would be recommended surveillance/biopsies/or surgery.  Lung cancer program has shown to save lives by early detection of lung cancer.   Last CT scan 2022- recommend follow up with PCP re: resuming lung cancer screening.   # DISPOSITION: # labs today-  # follow up in appx 3 weeks- MD; no labs- possible phlebotomy- dr.B.   Thank you Ms. Holdsworth for allowing me to participate in the care of your pleasant patient. Please do not hesitate to contact me with questions or concerns in the interim.  09/21/2022 3:31 PM  # 45 minutes face-to-face with the patient discussing the above plan of care; more than 50% of time spent on counseling and coordination.

## 2022-09-21 NOTE — Telephone Encounter (Signed)
Due to patient preference of not being on the highway late in the afternoon- He requested his appointment be 5 weeks. Appointment scheduled as patient preference.

## 2022-09-22 LAB — ERYTHROPOIETIN: Erythropoietin: 7.3 m[IU]/mL (ref 2.6–18.5)

## 2022-10-01 LAB — JAK2 GENOTYPR

## 2022-10-01 LAB — NGS JAK2 EXONS 12-15

## 2022-10-06 DIAGNOSIS — Z1211 Encounter for screening for malignant neoplasm of colon: Secondary | ICD-10-CM | POA: Diagnosis not present

## 2022-10-07 ENCOUNTER — Ambulatory Visit: Admission: RE | Admit: 2022-10-07 | Payer: Medicare Other | Source: Ambulatory Visit

## 2022-10-13 LAB — COLOGUARD: COLOGUARD: POSITIVE — AB

## 2022-10-13 NOTE — Addendum Note (Signed)
Addended by: Larae Grooms on: 10/13/2022 10:39 AM   Modules accepted: Orders

## 2022-10-13 NOTE — Progress Notes (Signed)
Please let patient know that his Cologuard test is positive.  I have placed a referral to gi for him to have a colonoscopy done.

## 2022-10-14 ENCOUNTER — Ambulatory Visit: Payer: Medicare Other

## 2022-10-18 ENCOUNTER — Ambulatory Visit: Payer: Medicare Other | Admitting: Internal Medicine

## 2022-10-19 ENCOUNTER — Ambulatory Visit: Payer: Medicare Other | Admitting: Nurse Practitioner

## 2022-10-20 ENCOUNTER — Encounter: Payer: Self-pay | Admitting: *Deleted

## 2022-10-25 ENCOUNTER — Inpatient Hospital Stay: Payer: Medicare Other

## 2022-10-25 ENCOUNTER — Ambulatory Visit: Admission: RE | Admit: 2022-10-25 | Payer: Medicare Other | Source: Ambulatory Visit

## 2022-10-25 ENCOUNTER — Encounter: Payer: Self-pay | Admitting: Internal Medicine

## 2022-10-25 ENCOUNTER — Inpatient Hospital Stay: Payer: Medicare Other | Attending: Internal Medicine | Admitting: Internal Medicine

## 2022-10-25 VITALS — BP 172/106 | HR 80 | Temp 98.2°F | Ht 73.0 in | Wt 269.1 lb

## 2022-10-25 DIAGNOSIS — D751 Secondary polycythemia: Secondary | ICD-10-CM | POA: Insufficient documentation

## 2022-10-25 DIAGNOSIS — D72829 Elevated white blood cell count, unspecified: Secondary | ICD-10-CM | POA: Insufficient documentation

## 2022-10-25 DIAGNOSIS — Z79899 Other long term (current) drug therapy: Secondary | ICD-10-CM | POA: Diagnosis not present

## 2022-10-25 DIAGNOSIS — F17210 Nicotine dependence, cigarettes, uncomplicated: Secondary | ICD-10-CM | POA: Insufficient documentation

## 2022-10-25 NOTE — Assessment & Plan Note (Addendum)
#  Erythrocytosis-asymptomatic.  Hb between 18-19; MARCH 2024- JAK-2; and exon 12 negative. EPO- WNL.  Most likely reactive/smoking.  Do not recommend a bone marrow biopsy.   # Primary polycythemia vera I discussed the role of phlebotomy in bringing the hematocrit/hemoglobin down-avoid any thromboembolic events.  The role of phlebotomy in secondary erythrocytosis is unclear/controversial.  However it is reasonable to phlebotomy as needed-to keep hematocrit less than 50.  Patient agrees with blood donation on a periodic basis.  Hold phlebotomy today in the clinic.  # Mild leukocytosis/neutrophilia-again likely secondary/smoking.  Do not recommend any further workup.  Stable.  # Smoker- Counseled against smoking. Pt stlll not inclined not to quit smoking at this time. Again reminded re:  resuming lung cancer screening.   # DISPOSITION: # No phlebotomy today # follow up as needed- Dr.B.

## 2022-10-25 NOTE — Progress Notes (Signed)
[facility] CONSULT NOTE  Patient Care Team: Larae Grooms, NP as PCP - General (Nurse Practitioner) Debbe Odea, MD as PCP - Cardiology (Cardiology)  CHIEF COMPLAINTS/PURPOSE OF CONSULTATION: ERYTHROCYTOSIS  HEMATOLOGY HISTORY  # ERYTHROCYTOSIS  [Hb; WBC; platelets]  HISTORY OF PRESENTING ILLNESS: Patient ambulating-independently. Alone.   Isaiah Brown 62 y.o.  male pleasant patient with history of smoking/active and erythrocytosis is here to review the results of his blood work ordered for last visit.  Patient admits to mild fatigue not significantly worse.  Otherwise denies any headaches nausea vomiting.  Review of Systems  Constitutional:  Positive for malaise/fatigue. Negative for chills, diaphoresis, fever and weight loss.  HENT:  Negative for nosebleeds and sore throat.   Eyes:  Negative for double vision.  Respiratory:  Negative for cough, hemoptysis, sputum production, shortness of breath and wheezing.   Cardiovascular:  Negative for chest pain, palpitations, orthopnea and leg swelling.  Gastrointestinal:  Negative for abdominal pain, blood in stool, constipation, diarrhea, heartburn, melena, nausea and vomiting.  Genitourinary:  Negative for dysuria, frequency and urgency.  Musculoskeletal:  Positive for back pain and joint pain.  Skin: Negative.  Negative for itching and rash.  Neurological:  Negative for dizziness, tingling, focal weakness, weakness and headaches.  Endo/Heme/Allergies:  Does not bruise/bleed easily.  Psychiatric/Behavioral:  Negative for depression. The patient is not nervous/anxious and does not have insomnia.      MEDICAL HISTORY:  Past Medical History:  Diagnosis Date   Anxiety    Aortic atherosclerosis    Chronic radicular lumbar pain    COPD (chronic obstructive pulmonary disease)    Hyperlipidemia    Hypertension    Hypothyroidism    Sleep apnea    not using CPAP at this time    SURGICAL HISTORY: Past  Surgical History:  Procedure Laterality Date   APPENDECTOMY     CHOLECYSTECTOMY     CYSTOSCOPY WITH URETHRAL DILATATION N/A 01/12/2021   Procedure: CYSTOSCOPY WITH URETHRAL DILATATION WITH OPTILUME BALLOON;  Surgeon: Vanna Scotland, MD;  Location: [facility] ORS;  Service: Urology;  Laterality: N/A;   HERNIA REPAIR     MOUTH SURGERY Left    Cyst on gum   SKIN CANCER EXCISION Left 2018   [place]    SOCIAL HISTORY: Social History   Socioeconomic History   Marital status: Single    Spouse name: Not on file   Number of children: Not on file   Years of education: Not on file   Highest education level: Not on file  Occupational History   Not on file  Tobacco Use   Smoking status: Every Day    Packs/day: 1.00    Years: 46.00    Additional pack years: 0.00    Total pack years: 46.00    Types: Cigars, Cigarettes   Smokeless tobacco: Never  Vaping Use   Vaping Use: Former  Substance and Sexual Activity   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   Drug use: Never   Sexual activity: Yes  Other Topics Concern   Not on file  Social History Narrative   Not on file   Social Determinants of Health   Financial Resource Strain: Not on file  Food Insecurity: No Food Insecurity (09/21/2022)   Hunger Vital Sign    Worried About Running Out of Food in the Last Year: Never true    Ran Out of Food in the Last Year: Never true  Transportation Needs: No  Transportation Needs (09/21/2022)   PRAPARE - Administrator, Civil Service (Medical): No    Lack of Transportation (Non-Medical): No  Physical Activity: Not on file  Stress: Not on file  Social Connections: Not on file  Intimate Partner Violence: Not At Risk (09/21/2022)   Humiliation, Afraid, Rape, and Kick questionnaire    Fear of Current or Ex-Partner: No    Emotionally Abused: No    Physically Abused: No    Sexually Abused: No    FAMILY HISTORY: Family History  Adopted: Yes  Family history unknown:  Yes    ALLERGIES:  is allergic to other.  MEDICATIONS:  Current Outpatient Medications  Medication Sig Dispense Refill   acetaminophen (TYLENOL) 500 MG tablet Take 2,000 mg by mouth every 6 (six) hours as needed for moderate pain.     albuterol (PROVENTIL) (2.5 MG/3ML) 0.083% nebulizer solution Take 3 mLs (2.5 mg total) by nebulization every 6 (six) hours as needed for wheezing or shortness of breath. 75 mL 12   albuterol (VENTOLIN HFA) 108 (90 Base) MCG/ACT inhaler Inhale 2 puffs into the lungs every 6 (six) hours as needed for wheezing or shortness of breath. 8.5 g 0   levothyroxine (SYNTHROID) 150 MCG tablet Take 1 tablet (150 mcg total) by mouth daily. 90 tablet 1   meloxicam (MOBIC) 7.5 MG tablet Take 1 tablet (7.5 mg total) by mouth daily. 30 tablet 2   Multiple Vitamins-Minerals (MULTIVITAMIN WITH MINERALS) tablet Take 1 tablet by mouth daily.     rosuvastatin (CRESTOR) 10 MG tablet Take 1 tablet (10 mg total) by mouth daily. 90 tablet 0   Tiotropium Bromide Monohydrate (SPIRIVA RESPIMAT) 2.5 MCG/ACT AERS Inhale 2 puffs into the lungs daily. 1 each 6   valsartan-hydrochlorothiazide (DIOVAN-HCT) 160-12.5 MG tablet Take 1 tablet by mouth daily. 90 tablet 1   No current facility-administered medications for this visit.     PHYSICAL EXAMINATION:   Vitals:   10/25/22 1314  BP: (!) 172/106  Pulse: 80  Temp: 98.2 F (36.8 C)  SpO2: 96%   Filed Weights   10/25/22 1314  Weight: 269 lb 1.6 oz (122.1 kg)    Physical Exam Vitals and nursing note reviewed.  HENT:     Head: Normocephalic and atraumatic.     Mouth/Throat:     Pharynx: Oropharynx is clear.  Eyes:     Extraocular Movements: Extraocular movements intact.     Pupils: Pupils are equal, round, and reactive to light.  Cardiovascular:     Rate and Rhythm: Normal rate and regular rhythm.  Pulmonary:     Comments: Decreased breath sounds bilaterally.  Abdominal:     Palpations: Abdomen is soft.  Musculoskeletal:         General: Normal range of motion.     Cervical back: Normal range of motion.  Skin:    General: Skin is warm.  Neurological:     General: No focal deficit present.     Mental Status: He is alert and oriented to person, place, and time.  Psychiatric:        Behavior: Behavior normal.        Judgment: Judgment normal.      LABORATORY DATA:  I have reviewed the data as listed Lab Results  Component Value Date   WBC 10.7 (H) 09/21/2022   HGB 19.2 (H) 09/21/2022   HCT 57.2 (H) 09/21/2022   MCV 93.2 09/21/2022   PLT 179 09/21/2022   Recent Labs  02/09/22 1200 09/16/22 1449 09/21/22 1449  NA 143 143 138  K 4.6 4.0 3.7  CL 103 106 105  CO2 GLUCOSE 78 89 102*  BUN CREATININE 1.14 1.13 1.06  CALCIUM 9.9 9.1 9.3  GFRNONAA  --   --  >60  PROT 7.3 6.9 7.9  ALBUMIN 4.8 4.7 4.5  AST ALT ALKPHOS 59 65 47  BILITOT 0.5 0.4 0.7   No results found.  ASSESSMENT & PLAN:   Erythrocytosis #Erythrocytosis-asymptomatic.  Hb between 18-19; MARCH 2024- JAK-2; and exon 12 negative. EPO- WNL.  Most likely reactive/smoking.  Do not recommend a bone marrow biopsy.   # Primary polycythemia vera I discussed the role of phlebotomy in bringing the hematocrit/hemoglobin down-avoid any thromboembolic events.  The role of phlebotomy in secondary erythrocytosis is unclear/controversial.  However it is reasonable to phlebotomy as needed-to keep hematocrit less than 50.  Patient agrees with blood donation on a periodic basis.  Hold phlebotomy today in the clinic.  # Mild leukocytosis/neutrophilia-again likely secondary/smoking.  Do not recommend any further workup.  Stable.  # Smoker- Counseled against smoking. Pt stlll not inclined not to quit smoking at this time. Again reminded re:  resuming lung cancer screening.   # DISPOSITION: # No phlebotomy today # follow up as needed- Dr.B.   10/25/2022 1:55 PM

## 2022-10-27 ENCOUNTER — Telehealth: Payer: Self-pay | Admitting: Nurse Practitioner

## 2022-10-27 NOTE — Telephone Encounter (Signed)
FYI

## 2022-10-27 NOTE — Telephone Encounter (Signed)
Copied from CRM [id number]. Topic: General - Other >> Oct 27, 2022 11:17 AM Pincus Sanes wrote: Reason for CRM: Pt just wanted Clydie Braun to know personally that he cancelled his appt with her on Monday as he has got a couple more appts lined up and was sure she would want all results before him coming back in. He will be in contact again shortly.

## 2022-11-01 ENCOUNTER — Ambulatory Visit: Payer: Medicare Other | Admitting: Nurse Practitioner

## 2022-12-03 ENCOUNTER — Other Ambulatory Visit: Payer: Self-pay | Admitting: Nurse Practitioner

## 2022-12-03 NOTE — Telephone Encounter (Signed)
Requested Prescriptions  Refused Prescriptions Disp Refills   valsartan-hydrochlorothiazide (DIOVAN-HCT) 80-12.5 MG tablet [Pharmacy Med Name: VALSARTAN-HCTZ 80-12.5 MG TAB] 90 tablet 0    Sig: Take 1 tablet by mouth daily.     Cardiovascular: ARB + Diuretic Combos Failed - 12/03/2022 12:04 PM      Failed - Last BP in normal range    BP Readings from Last 1 Encounters:  10/25/22 (!) 172/106         Passed - K in normal range and within 180 days    Potassium  Date Value Ref Range Status  09/21/2022 3.7 3.5 - 5.1 mmol/L Final         Passed - Na in normal range and within 180 days    Sodium  Date Value Ref Range Status  09/21/2022 138 135 - 145 mmol/L Final  09/16/2022 143 134 - 144 mmol/L Final         Passed - Cr in normal range and within 180 days    Creatinine, Ser  Date Value Ref Range Status  09/21/2022 1.06 0.61 - 1.24 mg/dL Final         Passed - eGFR is 10 or above and within 180 days    GFR, Estimated  Date Value Ref Range Status  09/21/2022 >60 >60 mL/min Final    Comment:    (NOTE) Calculated using the CKD-EPI Creatinine Equation (2021)    eGFR  Date Value Ref Range Status  09/16/2022 73 >59 mL/min/1.73 Final         Passed - Patient is not pregnant      Passed - Valid encounter within last 6 months    Recent Outpatient Visits           2 months ago [facility] [employer] preventive visit   [place] [facility] Larae Grooms, NP   7 months ago Hordeolum externum of right upper eyelid   [place] [facility] Larae Grooms, NP   9 months ago Hyperlipidemia, unspecified hyperlipidemia type   [place] [facility] Gabriel Cirri, NP   1 year ago Primary hypertension   [place] [facility] Vigg, Avanti, MD   1 year ago Primary hypertension   [facility] Loura Pardon, MD

## 2022-12-29 DIAGNOSIS — H5203 Hypermetropia, bilateral: Secondary | ICD-10-CM | POA: Diagnosis not present

## 2023-03-07 ENCOUNTER — Other Ambulatory Visit: Payer: Self-pay | Admitting: Nurse Practitioner

## 2023-03-09 ENCOUNTER — Other Ambulatory Visit: Payer: Self-pay | Admitting: Nurse Practitioner

## 2023-03-09 NOTE — Telephone Encounter (Signed)
Requested Prescriptions  Pending Prescriptions Disp Refills   rosuvastatin (CRESTOR) 10 MG tablet [Pharmacy Med Name: ROSUVASTATIN CALCIUM 10 MG TAB] 90 tablet 0    Sig: Take 1 tablet (10 mg total) by mouth daily.     Cardiovascular:  Antilipid - Statins 2 Failed - 03/07/2023 12:31 PM      Failed - Lipid Panel in normal range within the last 12 months    Cholesterol, Total  Date Value Ref Range Status  09/16/2022 167 100 - 199 mg/dL Final   LDL Chol Calc (NIH)  Date Value Ref Range Status  09/16/2022 99 0 - 99 mg/dL Final   HDL  Date Value Ref Range Status  09/16/2022 44 >39 mg/dL Final   Triglycerides  Date Value Ref Range Status  09/16/2022 137 0 - 149 mg/dL Final         Passed - Cr in normal range and within 360 days    Creatinine, Ser  Date Value Ref Range Status  09/21/2022 1.06 0.61 - 1.24 mg/dL Final         Passed - Patient is not pregnant      Passed - Valid encounter within last 12 months    Recent Outpatient Visits           5 months ago [facility] to [employer] preventive visit   [place] [facility] Larae Grooms, NP   10 months ago Hordeolum externum of right upper eyelid   [place] [facility] Larae Grooms, NP   1 year ago Hyperlipidemia, unspecified hyperlipidemia type   [facility] Gabriel Cirri, NP   1 year ago Primary hypertension   [place] [facility] Vigg, Avanti, MD   1 year ago Primary hypertension   [place] [facility] Loura Pardon, MD

## 2023-03-10 NOTE — Telephone Encounter (Signed)
Patient is overdue for an appointment. Please call to schedule and then route to provider for refill.  

## 2023-03-10 NOTE — Telephone Encounter (Signed)
Called and scheduled patient on 03/16/2023 @ 11:20 am.  Routing to provider for a refill.

## 2023-03-10 NOTE — Telephone Encounter (Signed)
Requested medications are due for refill today.  yes  Requested medications are on the active medications list.  yes  Last refill. 08/25/2022 #90 1 rf  Future visit scheduled.   no  Notes to clinic.  Abnormal labs.    Requested Prescriptions  Pending Prescriptions Disp Refills   levothyroxine (SYNTHROID) 150 MCG tablet [Pharmacy Med Name: LEVOTHYROXINE 150 MCG TABLET] 90 tablet 0    Sig: Take 1 tablet (150 mcg total) by mouth daily.     Endocrinology:  Hypothyroid Agents Failed - 03/09/2023 10:41 AM      Failed - TSH in normal range and within 360 days    TSH  Date Value Ref Range Status  09/16/2022 6.050 (H) 0.450 - 4.500 uIU/mL Final         Passed - Valid encounter within last 12 months    Recent Outpatient Visits           5 months ago [facility] [employer] preventive visit   [place] [facility] Larae Grooms, NP   10 months ago Hordeolum externum of right upper eyelid   [place] [facility] Larae Grooms, NP   1 year ago Hyperlipidemia, unspecified hyperlipidemia type   [place] [facility] Gabriel Cirri, NP   1 year ago Primary hypertension   [place] [facility] Vigg, Avanti, MD   1 year ago Primary hypertension    [place] [facility] Loura Pardon, MD

## 2023-03-16 ENCOUNTER — Ambulatory Visit (INDEPENDENT_AMBULATORY_CARE_PROVIDER_SITE_OTHER): Payer: Medicare Other | Admitting: Physician Assistant

## 2023-03-16 ENCOUNTER — Encounter: Payer: Self-pay | Admitting: Physician Assistant

## 2023-03-16 VITALS — BP 148/96 | HR 94 | Ht 73.0 in | Wt 272.2 lb

## 2023-03-16 DIAGNOSIS — E785 Hyperlipidemia, unspecified: Secondary | ICD-10-CM

## 2023-03-16 DIAGNOSIS — E039 Hypothyroidism, unspecified: Secondary | ICD-10-CM

## 2023-03-16 DIAGNOSIS — I10 Essential (primary) hypertension: Secondary | ICD-10-CM | POA: Diagnosis not present

## 2023-03-16 DIAGNOSIS — J439 Emphysema, unspecified: Secondary | ICD-10-CM

## 2023-03-16 MED ORDER — VALSARTAN-HYDROCHLOROTHIAZIDE 160-12.5 MG PO TABS: 1.0000 | ORAL_TABLET | Freq: Every day | ORAL | 1 refills | Status: DC

## 2023-03-16 MED ORDER — ALBUTEROL SULFATE HFA 108 (90 BASE) MCG/ACT IN AERS: 2.0000 | INHALATION_SPRAY | Freq: Four times a day (QID) | RESPIRATORY_TRACT | 0 refills | Status: AC | PRN

## 2023-03-16 MED ORDER — LEVOTHYROXINE SODIUM 150 MCG PO TABS: 150.0000 ug | ORAL_TABLET | Freq: Every day | ORAL | 1 refills | Status: DC

## 2023-03-16 MED ORDER — SPIRIVA RESPIMAT 2.5 MCG/ACT IN AERS: 2.0000 | INHALATION_SPRAY | Freq: Every day | RESPIRATORY_TRACT | 6 refills | Status: DC

## 2023-03-16 NOTE — Patient Instructions (Signed)
We will keep you updated on your lab results and if any changes are required  Your blood pressure was mildly elevated today.  If possible please take it at home using an electronic blood pressure cuff for the upper arm Record your blood pressure once per day and bring them back with you to your apt so we can make sure you are not developing high blood pressure.   Incorporating a minimum of 150 minutes (20-30 minutes per day) of moderate intensity physical activity can help improve your heart health and reduce the chances of high blood pressure and other cardiovascular risks. Incorporating a heart healthy diet can also help reduce the chances of heart attack and high cholesterol.  It was nice to meet you and I appreciate the opportunity to be involved in your care If you were satisfied with the care you received from me, I would greatly appreciate you saying so in the after-visit survey that is sent out following our visit.

## 2023-03-16 NOTE — Progress Notes (Signed)
Established Office Visit    Patient: Isaiah Brown   DOB: 04-12-1961   62 y.o. Male  MRN: [id number] Visit Date: 03/16/2023  Today's healthcare provider: Oswaldo Conroy Gordy Goar, PA-C  Introduced myself to the patient as a Secondary school teacher and provided education on APPs in clinical practice.    Chief Complaint  Patient presents with   Hypertension   Medication Refill    Patient has been requested refill on his Levothyroxine medication and says he has been out of the medication for six days now. Patient says he should be fine on his other prescriptions.    Subjective    HPI HPI     Medication Refill    Additional comments: Patient has been requested refill on his Levothyroxine medication and says he has been out of the medication for six days now. Patient says he should be fine on his other prescriptions.       Last edited by Malen Gauze, CMA on 03/16/2023 11:17 AM.       HYPERTENSION / HYPERLIPIDEMIA Satisfied with current treatment? yes Duration of hypertension: years BP monitoring frequency: not checking BP range:  BP medication side effects: no Past BP meds: Valsartan-hydrochlorothiazide 160-12.5 mg PO every day,  Duration of hyperlipidemia: years Cholesterol medication side effects: no Cholesterol supplements: none Past cholesterol medications: rosuvastatin (crestor) Medication compliance: good compliance Aspirin: no Recent stressors: no Recurrent headaches: no Visual changes: no Palpitations: no Dyspnea: yes- intermittent  Chest pain: no Lower extremity edema: no Dizzy/lightheaded: no   HYPOTHYROIDISM Thyroid control status:stable Satisfied with current treatment? yes Medication side effects: no Medication compliance: good compliance Etiology of hypothyroidism:  Recent dose adjustment:no Fatigue: no Cold intolerance: no Heat intolerance:  does not like the heat  Weight gain: yes- reports increased eating lately  Weight loss: no Constipation:  no Diarrhea/loose stools: no Palpitations: no Lower extremity edema: no Anxiety/depressed mood: no  He is not engaged in regular exercise     Medications: Outpatient Medications Prior to Visit  Medication Sig   acetaminophen (TYLENOL) 500 MG tablet Take 2,000 mg by mouth every 6 (six) hours as needed for moderate pain.   albuterol (PROVENTIL) (2.5 MG/3ML) 0.083% nebulizer solution Take 3 mLs (2.5 mg total) by nebulization every 6 (six) hours as needed for wheezing or shortness of breath.   meloxicam (MOBIC) 7.5 MG tablet Take 1 tablet (7.5 mg total) by mouth daily.   Multiple Vitamins-Minerals (MULTIVITAMIN WITH MINERALS) tablet Take 1 tablet by mouth daily.   rosuvastatin (CRESTOR) 10 MG tablet Take 1 tablet (10 mg total) by mouth daily.   [DISCONTINUED] albuterol (VENTOLIN HFA) 108 (90 Base) MCG/ACT inhaler Inhale 2 puffs into the lungs every 6 (six) hours as needed for wheezing or shortness of breath.   [DISCONTINUED] levothyroxine (SYNTHROID) 150 MCG tablet Take 1 tablet (150 mcg total) by mouth daily.   [DISCONTINUED] Tiotropium Bromide Monohydrate (SPIRIVA RESPIMAT) 2.5 MCG/ACT AERS Inhale 2 puffs into the lungs daily.   [DISCONTINUED] valsartan-hydrochlorothiazide (DIOVAN-HCT) 160-12.5 MG tablet Take 1 tablet by mouth daily.   No facility-administered medications prior to visit.    Review of Systems  Constitutional:  Negative for activity change, appetite change, chills, diaphoresis, fatigue and fever.  Eyes:  Negative for visual disturbance.  Respiratory:  Negative for chest tightness and shortness of breath.   Cardiovascular:  Negative for chest pain, palpitations and leg swelling.  Gastrointestinal:  Negative for constipation, diarrhea, nausea and vomiting.  Endocrine: Negative for cold intolerance and heat intolerance.  Skin:  Negative for color change and rash.  Neurological:  Negative for dizziness, light-headedness and headaches.  Psychiatric/Behavioral:  Negative for  decreased concentration, dysphoric mood and sleep disturbance. The patient is not nervous/anxious.         Objective    BP (!) 148/96 (BP Location: Right Arm, Patient Position: Sitting, Cuff Size: Large)   Pulse 94   Ht 6\' 1"  (1.854 m)   Wt 272 lb 3.2 oz (123.5 kg)   SpO2 95%   BMI 35.91 kg/m     Physical Exam Vitals reviewed.  Constitutional:      General: He is awake.     Appearance: Normal appearance. He is well-developed and well-groomed.  HENT:     Head: Normocephalic and atraumatic.  Cardiovascular:     Rate and Rhythm: Normal rate and regular rhythm.     Pulses: Normal pulses.          Radial pulses are 2+ on the right side and 2+ on the left side.     Heart sounds: Normal heart sounds. No murmur heard.    No friction rub. No gallop.  Pulmonary:     Effort: Pulmonary effort is normal.     Breath sounds: Normal breath sounds. No decreased air movement. No decreased breath sounds, wheezing, rhonchi or rales.  Musculoskeletal:     Cervical back: Normal range of motion.     Right lower leg: No edema.     Left lower leg: No edema.  Neurological:     Mental Status: He is alert.  Psychiatric:        Behavior: Behavior is cooperative.       Results for orders placed or performed in visit on 03/16/23  TSH  Result Value Ref Range   TSH 29.600 (H) 0.450 - 4.500 uIU/mL  T4, free  Result Value Ref Range   Free T4 0.74 (L) 0.82 - 1.77 ng/dL  Comp Met (CMET)  Result Value Ref Range   Glucose 100 (H) 70 - 99 mg/dL   BUN 19 8 - 27 mg/dL   Creatinine, Ser 1.61 0.76 - 1.27 mg/dL   eGFR 70 >09 UE/AVW/0.98   BUN/Creatinine Ratio 16 10 - 24   Sodium 140 134 - 144 mmol/L   Potassium 3.9 3.5 - 5.2 mmol/L   Chloride 102 96 - 106 mmol/L   CO2 21 20 - 29 mmol/L   Calcium 10.0 8.6 - 10.2 mg/dL   Total Protein 7.3 6.0 - 8.5 g/dL   Albumin 4.5 3.9 - 4.9 g/dL   Globulin, Total 2.8 1.5 - 4.5 g/dL   Bilirubin Total 0.4 0.0 - 1.2 mg/dL   Alkaline Phosphatase 56 44 - 121 IU/L    AST 21 0 - 40 IU/L   ALT 13 0 - 44 IU/L  Lipid Profile  Result Value Ref Range   Cholesterol, Total 165 100 - 199 mg/dL   Triglycerides 119 (H) 0 - 149 mg/dL   HDL 48 >14 mg/dL   VLDL Cholesterol Cal 28 5 - 40 mg/dL   LDL Chol Calc (NIH) 89 0 - 99 mg/dL   Chol/HDL Ratio 3.4 0.0 - 5.0 ratio    Assessment & Plan      Return in about 4 weeks (around 04/13/2023) for HTN.      Problem List Items Addressed This Visit       Cardiovascular and Mediastinum   Primary hypertension - Primary    Chronic, historic condition He is currently taking alsartan-hydrochlorothiazide 160-12.5  mg PO every day and appears to be tolerating well Recommend he starts checking BP at home and keeping log to review at follow up If not in goal at that time, may need to adjust medication regimen - reviewed this with patient Follow up in 1 months or sooner if concerns arise       Relevant Medications   valsartan-hydrochlorothiazide (DIOVAN-HCT) 160-12.5 MG tablet   Other Relevant Orders   Comp Met (CMET) (Completed)     Respiratory   Emphysema of lung (HCC)    Chronic, historic condition He denies concerns today  He is taking Spiriva 2.5 mcg as directed and has rescue inhaler as needed Continue with current regimen Follow up in 6 months or sooner if concerns arise        Relevant Medications   Tiotropium Bromide Monohydrate (SPIRIVA RESPIMAT) 2.5 MCG/ACT AERS   albuterol (VENTOLIN HFA) 108 (90 Base) MCG/ACT inhaler     Endocrine   Hypothyroidism    Chronic, ongoing He reports he ran out of Levothyroxine for about a week Recheck TSH and T4 today  Refills provided of Levothyroxine Results of lab work to dictate further management Follow up in 3 months or sooner if concerns arise        Relevant Medications   levothyroxine (SYNTHROID) 150 MCG tablet   Other Relevant Orders   TSH (Completed)   T4, free (Completed)     Other   Hyperlipidemia    Chronic.  Controlled.  Continue with  current medication regimen of Crestor.  Labs ordered today.  Return to clinic in 6 months for reevaluation.  Call sooner if concerns arise.        Relevant Medications   valsartan-hydrochlorothiazide (DIOVAN-HCT) 160-12.5 MG tablet   Other Relevant Orders   Lipid Profile (Completed)     Return in about 4 weeks (around 04/13/2023) for HTN.   I, Hasaan Radde E Gerald Honea, PA-C, have reviewed all documentation for this visit. The documentation on 03/18/23 for the exam, diagnosis, procedures, and orders are all accurate and complete.   Jacquelin Hawking, MHS, PA-C [facility]

## 2023-03-17 LAB — COMPREHENSIVE METABOLIC PANEL
ALT: 13 IU/L (ref 0–44)
AST: 21 IU/L (ref 0–40)
Albumin: 4.5 g/dL (ref 3.9–4.9)
Alkaline Phosphatase: 56 IU/L (ref 44–121)
BUN/Creatinine Ratio: 16 (ref 10–24)
BUN: 19 mg/dL (ref 8–27)
Bilirubin Total: 0.4 mg/dL (ref 0.0–1.2)
CO2: 21 mmol/L (ref 20–29)
Calcium: 10 mg/dL (ref 8.6–10.2)
Chloride: 102 mmol/L (ref 96–106)
Creatinine, Ser: 1.17 mg/dL (ref 0.76–1.27)
Globulin, Total: 2.8 g/dL (ref 1.5–4.5)
Glucose: 100 mg/dL — ABNORMAL HIGH (ref 70–99)
Potassium: 3.9 mmol/L (ref 3.5–5.2)
Sodium: 140 mmol/L (ref 134–144)
Total Protein: 7.3 g/dL (ref 6.0–8.5)
eGFR: 70 mL/min/{1.73_m2} (ref >59)

## 2023-03-17 LAB — TSH: TSH: 29.6 u[IU]/mL — ABNORMAL HIGH (ref 0.450–4.500)

## 2023-03-17 LAB — LIPID PANEL
Chol/HDL Ratio: 3.4 ratio (ref 0.0–5.0)
Cholesterol, Total: 165 mg/dL (ref 100–199)
HDL: 48 mg/dL (ref >39)
LDL Chol Calc (NIH): 89 mg/dL (ref 0–99)
Triglycerides: 163 mg/dL — ABNORMAL HIGH (ref 0–149)
VLDL Cholesterol Cal: 28 mg/dL (ref 5–40)

## 2023-03-17 LAB — T4, FREE: Free T4: 0.74 ng/dL — ABNORMAL LOW (ref 0.82–1.77)

## 2023-03-18 NOTE — Assessment & Plan Note (Addendum)
Chronic, historic condition He is currently taking alsartan-hydrochlorothiazide 160-12.5 mg PO every day and appears to be tolerating well Recommend he starts checking BP at home and keeping log to review at follow up If not in goal at that time, may need to adjust medication regimen - reviewed this with patient Follow up in 1 months or sooner if concerns arise

## 2023-03-18 NOTE — Assessment & Plan Note (Signed)
Chronic.  Controlled.  Continue with current medication regimen of Crestor.  Labs ordered today.  Return to clinic in 6 months for reevaluation.  Call sooner if concerns arise.

## 2023-03-18 NOTE — Progress Notes (Signed)
Your thyroid testing indicates that your thyroid is not adequately controlled This could be due to running  out of your medication recently - please pick this up and restart as soon as possible. I recommend we recheck your levels at your next apt to make sure they have stabilized appropriately before we consider making a dose change. Let us know if you have further questions or concerns.

## 2023-03-18 NOTE — Assessment & Plan Note (Signed)
Chronic, historic condition He denies concerns today  He is taking Spiriva 2.5 mcg as directed and has rescue inhaler as needed Continue with current regimen Follow up in 6 months or sooner if concerns arise

## 2023-03-18 NOTE — Assessment & Plan Note (Signed)
Chronic, ongoing He reports he ran out of Levothyroxine for about a week Recheck TSH and T4 today  Refills provided of Levothyroxine Results of lab work to dictate further management Follow up in 3 months or sooner if concerns arise

## 2023-04-20 ENCOUNTER — Ambulatory Visit (INDEPENDENT_AMBULATORY_CARE_PROVIDER_SITE_OTHER): Payer: Medicare Other | Admitting: Nurse Practitioner

## 2023-04-20 ENCOUNTER — Encounter: Payer: Self-pay | Admitting: Nurse Practitioner

## 2023-04-20 VITALS — BP 127/78 | HR 112 | Temp 97.7°F | Ht 73.0 in | Wt 269.0 lb

## 2023-04-20 DIAGNOSIS — I10 Essential (primary) hypertension: Secondary | ICD-10-CM | POA: Diagnosis not present

## 2023-04-20 DIAGNOSIS — Z716 Tobacco abuse counseling: Secondary | ICD-10-CM | POA: Diagnosis not present

## 2023-04-20 DIAGNOSIS — Z23 Encounter for immunization: Secondary | ICD-10-CM | POA: Diagnosis not present

## 2023-04-20 MED ORDER — BUPROPION HCL ER (XL) 150 MG PO TB24: 150.0000 mg | ORAL_TABLET | Freq: Every day | ORAL | 0 refills | Status: DC

## 2023-04-20 MED ORDER — ROSUVASTATIN CALCIUM 10 MG PO TABS: 10.0000 mg | ORAL_TABLET | Freq: Every day | ORAL | 1 refills | Status: DC

## 2023-04-20 NOTE — Assessment & Plan Note (Signed)
Chronic.  Controlled.  Continue with current medication regimen of valsartan with HCTZ.  Return to clinic in 2 months for reevaluation.  Call sooner if concerns arise.

## 2023-04-20 NOTE — Progress Notes (Signed)
BP 127/78 (BP Location: Left Arm, Cuff Size: Large)   Pulse (!) 112   Temp 97.7 F (36.5 C) (Oral)   Ht 6\' 1"  (1.854 m)   Wt 269 lb (122 kg)   SpO2 94%   BMI 35.49 kg/m    Subjective:    Patient ID: Isaiah Brown, male    DOB: Jun 08, 1961, 62 y.o.   MRN: [id number]  HPI: Isaiah Brown is a 62 y.o. male  Chief Complaint  Patient presents with   Hypertension   Nicotine Dependence    Patient states he would like to discuss starting Bupropion for smoking cessation   Arthritis    Patient states he would like to discuss arthritis in his L foot.    HYPERTENSION without Chronic Kidney Disease Hypertension status: controlled  Satisfied with current treatment? yes Duration of hypertension: years BP monitoring frequency:  not checking BP range:  BP medication side effects:  no Medication compliance: excellent compliance Previous BP meds:valsartan-HCTZ Aspirin: no Recurrent headaches: no Visual changes: no Palpitations: no Dyspnea: no Chest pain: no Lower extremity edema: no Dizzy/lightheaded: no  SMOKING CESSATION Smoking Status: Current everyday smoker Smoking Amount: 1 ppd Smoking Onset: 1978 Smoking Quit Date: Not yet Smoking triggers: Type of tobacco use: tobacco Children in the house: no      Relevant past medical, surgical, family and social history reviewed and updated as indicated. Interim medical history since our last visit reviewed. Allergies and medications reviewed and updated.  Review of Systems  Eyes:  Negative for visual disturbance.  Respiratory:  Negative for chest tightness and shortness of breath.   Cardiovascular:  Negative for chest pain, palpitations and leg swelling.  Musculoskeletal:  Positive for arthralgias.  Neurological:  Negative for dizziness, light-headedness and headaches.    Per HPI unless specifically indicated above     Objective:    BP 127/78 (BP Location: Left Arm, Cuff Size: Large)   Pulse (!) 112   Temp 97.7 F  (36.5 C) (Oral)   Ht 6\' 1"  (1.854 m)   Wt 269 lb (122 kg)   SpO2 94%   BMI 35.49 kg/m   Wt Readings from Last 3 Encounters:  04/20/23 269 lb (122 kg)  03/16/23 272 lb 3.2 oz (123.5 kg)  10/25/22 269 lb 1.6 oz (122.1 kg)    Physical Exam Vitals and nursing note reviewed.  Constitutional:      General: He is not in acute distress.    Appearance: Normal appearance. He is not ill-appearing, toxic-appearing or diaphoretic.  HENT:     Head: Normocephalic.     Right Ear: External ear normal.     Left Ear: External ear normal.     Nose: Nose normal. No congestion or rhinorrhea.     Mouth/Throat:     Mouth: Mucous membranes are moist.  Eyes:     General:        Right eye: No discharge.        Left eye: No discharge.     Extraocular Movements: Extraocular movements intact.     Conjunctiva/sclera: Conjunctivae normal.     Pupils: Pupils are equal, round, and reactive to light.  Cardiovascular:     Rate and Rhythm: Normal rate and regular rhythm.     Heart sounds: No murmur heard. Pulmonary:     Effort: Pulmonary effort is normal. No respiratory distress.     Breath sounds: Normal breath sounds. No wheezing, rhonchi or rales.  Abdominal:     General: Abdomen is  flat. Bowel sounds are normal.  Musculoskeletal:     Cervical back: Normal range of motion and neck supple.  Skin:    General: Skin is warm and dry.     Capillary Refill: Capillary refill takes less than 2 seconds.  Neurological:     General: No focal deficit present.     Mental Status: He is alert and oriented to person, place, and time.  Psychiatric:        Mood and Affect: Mood normal.        Behavior: Behavior normal.        Thought Content: Thought content normal.        Judgment: Judgment normal.     Results for orders placed or performed in visit on 03/16/23  TSH  Result Value Ref Range   TSH 29.600 (H) 0.450 - 4.500 uIU/mL  T4, free  Result Value Ref Range   Free T4 0.74 (L) 0.82 - 1.77 ng/dL  Comp Met  (CMET)  Result Value Ref Range   Glucose 100 (H) 70 - 99 mg/dL   BUN 19 8 - 27 mg/dL   Creatinine, Ser 1.61 0.76 - 1.27 mg/dL   eGFR 70 >09 UE/AVW/0.98   BUN/Creatinine Ratio 16 10 - 24   Sodium 140 134 - 144 mmol/L   Potassium 3.9 3.5 - 5.2 mmol/L   Chloride 102 96 - 106 mmol/L   CO2 21 20 - 29 mmol/L   Calcium 10.0 8.6 - 10.2 mg/dL   Total Protein 7.3 6.0 - 8.5 g/dL   Albumin 4.5 3.9 - 4.9 g/dL   Globulin, Total 2.8 1.5 - 4.5 g/dL   Bilirubin Total 0.4 0.0 - 1.2 mg/dL   Alkaline Phosphatase 56 44 - 121 IU/L   AST 21 0 - 40 IU/L   ALT 13 0 - 44 IU/L  Lipid Profile  Result Value Ref Range   Cholesterol, Total 165 100 - 199 mg/dL   Triglycerides 119 (H) 0 - 149 mg/dL   HDL 48 >14 mg/dL   VLDL Cholesterol Cal 28 5 - 40 mg/dL   LDL Chol Calc (NIH) 89 0 - 99 mg/dL   Chol/HDL Ratio 3.4 0.0 - 5.0 ratio      Assessment & Plan:   Problem List Items Addressed This Visit       Cardiovascular and Mediastinum   Primary hypertension    Chronic.  Controlled.  Continue with current medication regimen of valsartan with HCTZ.  Return to clinic in 2 months for reevaluation.  Call sooner if concerns arise.        Relevant Medications   rosuvastatin (CRESTOR) 10 MG tablet     Other   Encounter for smoking cessation counseling    Will restart Wellbutrin 150mg  daily to help with smoking cessation.  Discussed side effects and benefits of medication discussed. Follow up in 2 months.  Call sooner if concerns arise.       Other Visit Diagnoses     Need for influenza vaccination    -  Primary   Relevant Orders   Flu vaccine trivalent PF, 6mos and older(Flulaval,Afluria,Fluarix,Fluzone) (Completed)        Follow up plan: Return in about 2 months (around 06/20/2023) for Smoking Cessation.

## 2023-04-20 NOTE — Assessment & Plan Note (Signed)
Will restart Wellbutrin 150mg  daily to help with smoking cessation.  Discussed side effects and benefits of medication discussed. Follow up in 2 months.  Call sooner if concerns arise.

## 2023-05-12 ENCOUNTER — Emergency Department
Admission: EM | Admit: 2023-05-12 | Discharge: 2023-05-12 | Payer: Medicare Other | Attending: Emergency Medicine | Admitting: Emergency Medicine

## 2023-05-12 ENCOUNTER — Emergency Department: Payer: Medicare Other

## 2023-05-12 ENCOUNTER — Ambulatory Visit: Payer: Self-pay

## 2023-05-12 ENCOUNTER — Other Ambulatory Visit: Payer: Self-pay

## 2023-05-12 DIAGNOSIS — I10 Essential (primary) hypertension: Secondary | ICD-10-CM | POA: Insufficient documentation

## 2023-05-12 DIAGNOSIS — Z9889 Other specified postprocedural states: Secondary | ICD-10-CM | POA: Insufficient documentation

## 2023-05-12 DIAGNOSIS — H40059 Ocular hypertension, unspecified eye: Secondary | ICD-10-CM

## 2023-05-12 DIAGNOSIS — H579 Unspecified disorder of eye and adnexa: Secondary | ICD-10-CM | POA: Diagnosis not present

## 2023-05-12 DIAGNOSIS — G4730 Sleep apnea, unspecified: Secondary | ICD-10-CM | POA: Insufficient documentation

## 2023-05-12 DIAGNOSIS — H40211 Acute angle-closure glaucoma, right eye: Secondary | ICD-10-CM | POA: Insufficient documentation

## 2023-05-12 DIAGNOSIS — Z743 Need for continuous supervision: Secondary | ICD-10-CM | POA: Diagnosis not present

## 2023-05-12 DIAGNOSIS — E039 Hypothyroidism, unspecified: Secondary | ICD-10-CM | POA: Diagnosis not present

## 2023-05-12 DIAGNOSIS — H2513 Age-related nuclear cataract, bilateral: Secondary | ICD-10-CM | POA: Diagnosis not present

## 2023-05-12 DIAGNOSIS — H5711 Ocular pain, right eye: Secondary | ICD-10-CM

## 2023-05-12 DIAGNOSIS — I700 Atherosclerosis of aorta: Secondary | ICD-10-CM | POA: Diagnosis not present

## 2023-05-12 DIAGNOSIS — J449 Chronic obstructive pulmonary disease, unspecified: Secondary | ICD-10-CM | POA: Diagnosis not present

## 2023-05-12 DIAGNOSIS — Z79899 Other long term (current) drug therapy: Secondary | ICD-10-CM | POA: Insufficient documentation

## 2023-05-12 DIAGNOSIS — E785 Hyperlipidemia, unspecified: Secondary | ICD-10-CM | POA: Diagnosis not present

## 2023-05-12 DIAGNOSIS — Z7401 Bed confinement status: Secondary | ICD-10-CM | POA: Diagnosis not present

## 2023-05-12 DIAGNOSIS — I639 Cerebral infarction, unspecified: Secondary | ICD-10-CM | POA: Diagnosis present

## 2023-05-12 DIAGNOSIS — M5459 Other low back pain: Secondary | ICD-10-CM | POA: Insufficient documentation

## 2023-05-12 DIAGNOSIS — R29818 Other symptoms and signs involving the nervous system: Secondary | ICD-10-CM | POA: Diagnosis not present

## 2023-05-12 DIAGNOSIS — R6889 Other general symptoms and signs: Secondary | ICD-10-CM | POA: Diagnosis not present

## 2023-05-12 DIAGNOSIS — H538 Other visual disturbances: Secondary | ICD-10-CM | POA: Diagnosis present

## 2023-05-12 DIAGNOSIS — H18413 Arcus senilis, bilateral: Secondary | ICD-10-CM | POA: Diagnosis not present

## 2023-05-12 DIAGNOSIS — G4489 Other headache syndrome: Secondary | ICD-10-CM | POA: Diagnosis not present

## 2023-05-12 LAB — DIFFERENTIAL
Abs Immature Granulocytes: 0.06 10*3/uL (ref 0.00–0.07)
Basophils Absolute: 0.1 10*3/uL (ref 0.0–0.1)
Basophils Relative: 1 %
Eosinophils Absolute: 0.2 10*3/uL (ref 0.0–0.5)
Eosinophils Relative: 2 %
Immature Granulocytes: 1 %
Lymphocytes Relative: 12 %
Lymphs Abs: 1.3 10*3/uL (ref 0.7–4.0)
Monocytes Absolute: 0.8 10*3/uL (ref 0.1–1.0)
Monocytes Relative: 8 %
Neutro Abs: 8.5 10*3/uL — ABNORMAL HIGH (ref 1.7–7.7)
Neutrophils Relative %: 76 %

## 2023-05-12 LAB — CBC
HCT: 51.7 % (ref 39.0–52.0)
Hemoglobin: 17.6 g/dL — ABNORMAL HIGH (ref 13.0–17.0)
MCH: 31.4 pg (ref 26.0–34.0)
MCHC: 34 g/dL (ref 30.0–36.0)
MCV: 92.3 fL (ref 80.0–100.0)
Platelets: 216 10*3/uL (ref 150–400)
RBC: 5.6 MIL/uL (ref 4.22–5.81)
RDW: 13.3 % (ref 11.5–15.5)
WBC: 11 10*3/uL — ABNORMAL HIGH (ref 4.0–10.5)
nRBC: 0 % (ref 0.0–0.2)

## 2023-05-12 LAB — COMPREHENSIVE METABOLIC PANEL
ALT: 16 U/L (ref 0–44)
AST: 21 U/L (ref 15–41)
Albumin: 4.2 g/dL (ref 3.5–5.0)
Alkaline Phosphatase: 49 U/L (ref 38–126)
Anion gap: 9 (ref 5–15)
BUN: 16 mg/dL (ref 8–23)
CO2: 25 mmol/L (ref 22–32)
Calcium: 9.2 mg/dL (ref 8.9–10.3)
Chloride: 103 mmol/L (ref 98–111)
Creatinine, Ser: 1.05 mg/dL (ref 0.61–1.24)
GFR, Estimated: >60 mL/min (ref >60)
Glucose, Bld: 100 mg/dL — ABNORMAL HIGH (ref 70–99)
Potassium: 3.9 mmol/L (ref 3.5–5.1)
Sodium: 137 mmol/L (ref 135–145)
Total Bilirubin: 1 mg/dL (ref <1.2)
Total Protein: 7.6 g/dL (ref 6.5–8.1)

## 2023-05-12 LAB — ETHANOL: Alcohol, Ethyl (B): <10 mg/dL (ref <10)

## 2023-05-12 LAB — APTT: aPTT: 36 s (ref 24–36)

## 2023-05-12 LAB — C-REACTIVE PROTEIN: CRP: 0.8 mg/dL (ref <1.0)

## 2023-05-12 LAB — SEDIMENTATION RATE: Sed Rate: 13 mm/h (ref 0–20)

## 2023-05-12 LAB — PROTIME-INR
INR: 1 (ref 0.8–1.2)
Prothrombin Time: 13.2 s (ref 11.4–15.2)

## 2023-05-12 MED ORDER — ONDANSETRON HCL 4 MG/2ML IJ SOLN: 4.0000 mg | Freq: Once | INTRAMUSCULAR | Status: AC

## 2023-05-12 MED ORDER — FENTANYL CITRATE PF 50 MCG/ML IJ SOSY: 75.0000 ug | PREFILLED_SYRINGE | Freq: Once | INTRAMUSCULAR | Status: AC

## 2023-05-12 MED ORDER — TETRACAINE HCL 0.5 % OP SOLN
2.0000 [drp] | Freq: Once | OPHTHALMIC | Status: AC
  Administered 2023-05-12: 2 [drp] via OPHTHALMIC

## 2023-05-12 MED ORDER — DORZOLAMIDE HCL 2 % OP SOLN
1.0000 [drp] | Freq: Once | OPHTHALMIC | Status: AC
  Administered 2023-05-12: 1 [drp] via OPHTHALMIC
  Filled 2023-05-12: qty 10

## 2023-05-12 MED ORDER — TIMOLOL MALEATE 0.5 % OP SOLN
1.0000 [drp] | Freq: Once | OPHTHALMIC | Status: AC
  Administered 2023-05-12: 1 [drp] via OPHTHALMIC
  Filled 2023-05-12: qty 5

## 2023-05-12 MED ORDER — LATANOPROST 0.005 % OP SOLN
1.0000 [drp] | Freq: Once | OPHTHALMIC | Status: AC
Start: 2023-05-12 — End: 2023-05-12
  Administered 2023-05-12: 1 [drp] via OPHTHALMIC
  Filled 2023-05-12: qty 2.5

## 2023-05-12 MED ORDER — FLUORESCEIN SODIUM 1 MG OP STRP: 1.0000 | ORAL_STRIP | Freq: Once | OPHTHALMIC | Status: DC

## 2023-05-12 MED ORDER — FENTANYL CITRATE PF 50 MCG/ML IJ SOSY
PREFILLED_SYRINGE | INTRAMUSCULAR | Status: AC
  Administered 2023-05-12: 75 ug via INTRAVENOUS
  Filled 2023-05-12: qty 2

## 2023-05-12 MED ORDER — BRIMONIDINE TARTRATE 0.15 % OP SOLN
1.0000 [drp] | Freq: Once | OPHTHALMIC | Status: AC
  Administered 2023-05-12: 1 [drp] via OPHTHALMIC
  Filled 2023-05-12: qty 5

## 2023-05-12 MED ORDER — ONDANSETRON HCL 4 MG/2ML IJ SOLN
INTRAMUSCULAR | Status: AC
  Administered 2023-05-12: 4 mg via INTRAVENOUS
  Filled 2023-05-12: qty 2

## 2023-05-12 NOTE — Progress Notes (Signed)
   05/12/23 1500  Spiritual Encounters  Type of Visit Initial  Care provided to: Pt not available  Referral source Code page  Reason for visit Code  OnCall Visit Yes   Chaplain responded to Code Stroke. Patient at CT. No family present. Code canceled at 1538.

## 2023-05-12 NOTE — ED Notes (Signed)
Code Stroke  Called to [facility] at [facility] with right visual changes and headache with LKW 1130A per Dr Erma Heritage

## 2023-05-12 NOTE — Progress Notes (Signed)
CODE STROKE- PHARMACY COMMUNICATION   Time CODE STROKE called/page received: 1516  Time response to CODE STROKE was made (in person or via phone): Immediately, in person  Time Stroke Kit retrieved from [facility] (only if needed): No thrombolytic, per neurologist  Name of Provider/Nurse contacted: Dr. Amada Jupiter  Past Medical History:  Diagnosis Date   Anxiety    Aortic atherosclerosis (HCC)    Chronic radicular lumbar pain    COPD (chronic obstructive pulmonary disease) (HCC)    Hyperlipidemia    Hypertension    Hypothyroidism    Sleep apnea    not using CPAP at this time   Prior to Admission medications   Medication Sig Start Date End Date Taking? Authorizing Provider  acetaminophen (TYLENOL) 500 MG tablet Take 2,000 mg by mouth every 6 (six) hours as needed for moderate pain.    [provider]  albuterol (PROVENTIL) (2.5 MG/3ML) 0.083% nebulizer solution Take 3 mLs (2.5 mg total) by nebulization every 6 (six) hours as needed for wheezing or shortness of breath. 10/20/20   Vigg, Avanti, MD  albuterol (VENTOLIN HFA) 108 (90 Base) MCG/ACT inhaler Inhale 2 puffs into the lungs every 6 (six) hours as needed for wheezing or shortness of breath. 03/16/23   Mecum, Erin E, PA-C  buPROPion (WELLBUTRIN XL) 150 MG 24 hr tablet Take 1 tablet (150 mg total) by mouth daily. 04/20/23   Larae Grooms, NP  levothyroxine (SYNTHROID) 150 MCG tablet Take 1 tablet (150 mcg total) by mouth daily. 03/16/23   Mecum, Erin E, PA-C  meloxicam (MOBIC) 7.5 MG tablet Take 1 tablet (7.5 mg total) by mouth daily. 09/16/22   Larae Grooms, NP  Multiple Vitamins-Minerals (MULTIVITAMIN WITH MINERALS) tablet Take 1 tablet by mouth daily.    [provider]  rosuvastatin (CRESTOR) 10 MG tablet Take 1 tablet (10 mg total) by mouth daily. 04/20/23   Larae Grooms, NP  Tiotropium Bromide Monohydrate (SPIRIVA RESPIMAT) 2.5 MCG/ACT AERS Inhale 2 puffs into the lungs daily. 03/16/23   Mecum, Erin E,  PA-C  valsartan-hydrochlorothiazide (DIOVAN-HCT) 160-12.5 MG tablet Take 1 tablet by mouth daily. 03/16/23   Mecum, Oswaldo Conroy, PA-C    Will M. Dareen Piano, PharmD Clinical Pharmacist 05/12/2023 3:26 PM

## 2023-05-12 NOTE — Progress Notes (Signed)
1516 stroke cart activated and elert sent to [facility]. Pt currently in CT and assessed by EDP Erma Heritage, MD) prior to cart activation. Pt presented to ED with c/o R eye pain and vision changes with headache and LKW 1100. 1518 Dr. Amada Jupiter with [facility] in [place] to assess pt.  1520 Code stroke cancelled by Dr. Amada Jupiter. 1521 No further needs from [facility]. TSRN disconnected from cart at this time.

## 2023-05-12 NOTE — ED Notes (Signed)
EMS here to pick up patient now. Will be transporting patient to the [facility].

## 2023-05-12 NOTE — Consult Note (Signed)
NEUROLOGY CONSULT NOTE   Date of service: May 12, 2023 Patient Name: Isaiah Brown MRN:  [id number] DOB:  [birth year] Chief Complaint: "eye pain" Requesting Provider: Shaune Pollack, MD  History of Present Illness  Elhadji Pecore is a 62 y.o. male  has a past medical history of Anxiety, Aortic atherosclerosis (HCC), Chronic radicular lumbar pain, COPD (chronic obstructive pulmonary disease) (HCC), Hyperlipidemia, Hypertension, Hypothyroidism, and Sleep apnea. who presents with right eye pain and blurriness.  He states that he had something similar happen about 2 weeks ago, but it cleared up fairly quickly.  He states that around 11, he had a sudden onset "searing" pain in his right eye and since that time his gotten progressively more blurry.  He was hoping it would clear up located 2 weeks ago, and therefore waited a little bit before coming in.  On presentation to the emergency department, he was nearing the end of a 4-1/2-hour stroke window and therefore, given his visual change, a code stroke activation was undertaken.   LKW: 11am Modified rankin score: 0-Completely asymptomatic and back to baseline post- stroke IV Thrombolysis: no, not a stroke   Past History   Past Medical History:  Diagnosis Date   Anxiety    Aortic atherosclerosis (HCC)    Chronic radicular lumbar pain    COPD (chronic obstructive pulmonary disease) (HCC)    Hyperlipidemia    Hypertension    Hypothyroidism    Sleep apnea    not using CPAP at this time    Past Surgical History:  Procedure Laterality Date   APPENDECTOMY     CHOLECYSTECTOMY     CYSTOSCOPY WITH URETHRAL DILATATION N/A 01/12/2021   Procedure: CYSTOSCOPY WITH URETHRAL DILATATION WITH OPTILUME BALLOON;  Surgeon: Vanna Scotland, MD;  Location: [facility] ORS;  Service: Urology;  Laterality: N/A;   HERNIA REPAIR     MOUTH SURGERY Left    Cyst on gum   SKIN CANCER EXCISION Left 2018   [place]    Family History: Family History  Adopted: Yes   Family history unknown: Yes    Social History  reports that he has been smoking cigars and cigarettes. He has a 46 pack-year smoking history. He has never used smokeless tobacco. He reports that he does not currently use alcohol. He reports that he does not use drugs.  Allergies  Allergen Reactions   Other Rash    Nicotine Patch    Medications   Current Facility-Administered Medications:    brimonidine (ALPHAGAN) 0.15 % ophthalmic solution 1 drop, 1 drop, Right Eye, Once, Shaune Pollack, MD   dorzolamide (TRUSOPT) 2 % ophthalmic solution 1 drop, 1 drop, Right Eye, Once, Shaune Pollack, MD   fluorescein ophthalmic strip 1 strip, 1 strip, Both Eyes, Once, Shaune Pollack, MD   latanoprost (XALATAN) 0.005 % ophthalmic solution 1 drop, 1 drop, Right Eye, Once, Shaune Pollack, MD   tetracaine (PONTOCAINE) 0.5 % ophthalmic solution 2 drop, 2 drop, Right Eye, Once, Shaune Pollack, MD   timolol (TIMOPTIC) 0.5 % ophthalmic solution 1 drop, 1 drop, Right Eye, Once, Shaune Pollack, MD  Current Outpatient Medications:    acetaminophen (TYLENOL) 500 MG tablet, Take 2,000 mg by mouth every 6 (six) hours as needed for moderate pain., Disp: , Rfl:    albuterol (PROVENTIL) (2.5 MG/3ML) 0.083% nebulizer solution, Take 3 mLs (2.5 mg total) by nebulization every 6 (six) hours as needed for wheezing or shortness of breath., Disp: 75 mL, Rfl: 12   albuterol (VENTOLIN HFA) 108 (90 Base) MCG/ACT  inhaler, Inhale 2 puffs into the lungs every 6 (six) hours as needed for wheezing or shortness of breath., Disp: 8.5 g, Rfl: 0   buPROPion (WELLBUTRIN XL) 150 MG 24 hr tablet, Take 1 tablet (150 mg total) by mouth daily., Disp: 60 tablet, Rfl: 0   levothyroxine (SYNTHROID) 150 MCG tablet, Take 1 tablet (150 mcg total) by mouth daily., Disp: 90 tablet, Rfl: 1   meloxicam (MOBIC) 7.5 MG tablet, Take 1 tablet (7.5 mg total) by mouth daily., Disp: 30 tablet, Rfl: 2   Multiple Vitamins-Minerals (MULTIVITAMIN WITH  MINERALS) tablet, Take 1 tablet by mouth daily., Disp: , Rfl:    rosuvastatin (CRESTOR) 10 MG tablet, Take 1 tablet (10 mg total) by mouth daily., Disp: 90 tablet, Rfl: 1   Tiotropium Bromide Monohydrate (SPIRIVA RESPIMAT) 2.5 MCG/ACT AERS, Inhale 2 puffs into the lungs daily., Disp: 1 each, Rfl: 6   valsartan-hydrochlorothiazide (DIOVAN-HCT) 160-12.5 MG tablet, Take 1 tablet by mouth daily., Disp: 90 tablet, Rfl: 1  Vitals   Vitals:   05-29-2023 1539 29-May-2023 1542  BP:  (!) 185/107  Pulse:  67  Temp:  98.5 F (36.9 C)  TempSrc:  Oral  SpO2:  97%  Weight: 122.5 kg   Height: 6\' 1"  (1.854 m)     Body mass index is 35.62 kg/m.  Physical Exam   Constitutional: Appears well-developed and well-nourished.   Neurologic Examination    Physical Exam  Constitutional: Appears well-developed and well-nourished.  Eyes: The cornea on the right is blurred, difficult to visualize the fundus due to blurring.  There is conjunctival injection.  Neuro: Mental Status: Patient is awake, alert, oriented to person, place, month, year, and situation. Patient is able to give a clear and coherent history. No signs of aphasia or neglect Cranial Nerves: II: Visual Fields are full.  Right pupil is minimally reactive 4 to 5 mm, left pupil is 3 mm and reactive III,IV, VI: EOMI without ptosis or diploplia.  V: Facial sensation is symmetric to temperature VII: Facial movement is symmetric.  VIII: hearing is intact to voice X: Uvula elevates symmetrically XI: Shoulder shrug is symmetric. XII: tongue is midline without atrophy or fasciculations.  Motor: Tone is normal. Bulk is normal. 5/5 strength was present in all four extremities.  Sensory: Sensation is symmetric to light touch and temperature in the arms and legs. Cerebellar: FNF and HKS are intact bilaterally    Labs/Imaging/Neurodiagnostic studies   CBC:  Recent Labs  Lab May 29, 2023 1551  WBC 11.0*  NEUTROABS 8.5*  HGB 17.6*  HCT 51.7   MCV 92.3  PLT 216    Basic Metabolic Panel:  Lab Results  Component Value Date   NA 140 03/16/2023   K 3.9 03/16/2023   CO2 21 03/16/2023   GLUCOSE 100 (H) 03/16/2023   BUN 19 03/16/2023   CREATININE 1.17 03/16/2023   CALCIUM 10.0 03/16/2023   GFRNONAA >60 09/21/2022    Lipid Panel:  Lab Results  Component Value Date   LDLCALC 89 03/16/2023    HgbA1c:  Lab Results  Component Value Date   HGBA1C 5.2 01/19/2021    Urine Drug Screen: No results found for: "LABOPIA", "COCAINSCRNUR", "LABBENZ", "AMPHETMU", "THCU", "LABBARB"   Alcohol Level No results found for: "ETH"  INR No results found for: "INR"  APTT No results found for: "APTT"  AED levels: No results found for: "PHENYTOIN", "ZONISAMIDE", "LAMOTRIGINE", "LEVETIRACETA"    CT Head without contrast(Personally reviewed): Negative  Impression   Keian Odriscoll is a 62  y.o. male  has a past medical history of Anxiety, Aortic atherosclerosis (HCC), Chronic radicular lumbar pain, COPD (chronic obstructive pulmonary disease) (HCC), Hyperlipidemia, Hypertension, Hypothyroidism, and Sleep apnea.  He presents with acute painful right eye blurriness with an injected right eye, corneal haziness, midpoint minimally reactive pupil.  Given the abrupt onset, this constellation of symptoms is highly concerning for acute angle-closure glaucoma.  I would not pursue any further neurological testing at this time.  Recommendations  eye pressure assessment Ophthalmological consultation Please call if any further questions or concerns remain ______________________________________________________________________    Signed,  Ritta Slot [facility]

## 2023-05-12 NOTE — ED Triage Notes (Signed)
Patient presents via [facility] for c/o sudden vision change to R eye that occurred around 1100 this morning. States that he had pain to his eye and behind his eye and feels like "a grey filter" is covering his right field of vision now.   Code stroke called upon arrival and taken immediately to CT.  While in CT, Dr. Amada Jupiter at bedside to evaluate patient. Upon pt evaluation, code stroke cancelled.

## 2023-05-12 NOTE — Code Documentation (Signed)
Stroke Response Nurse Documentation Code Documentation  Isaiah Brown is a 62 y.o. male arriving to [facility] via [facility] on 05/12/2023 with past medical hx of HTN, HLD, aortic atherosclerosis, hypothyroidism, chronic radicular lumbar pain, sleep apnea, CPOD. On No antithrombotic. Code stroke was activated by ED.   Patient from home where he was LKW at 1100 and now complaining of sudden onset right eye pain and vision changes.   Stroke team at the bedside on patient arrival. Labs drawn and patient cleared for CT by Dr. Erma Heritage. Patient to CT with team. NIHSS 0, see documentation for details and code stroke times. The following imaging was completed:  CT Head. Patient is not a candidate for IV Thrombolytic due to not a stroke, per MD. Patient is not a candidate for IR due to not a stroke, per MD.   Care Plan: code stroke cancelled. No further imaging or NIHSS required, per MD.  Bedside handoff with ED RN Annabelle Harman.    Wille Glaser  Stroke Response RN

## 2023-05-12 NOTE — ED Notes (Signed)
Called [facility] spoke to TAmmy cancelled code stroke per Dr. Amada Jupiter and Leafy Ro  [phone]

## 2023-05-12 NOTE — Telephone Encounter (Signed)
     Chief Complaint: Right eye pain, sudden onset, blurred vision, headache Symptoms: Above Frequency: Today Pertinent Negatives: Patient denies  Disposition: [x] ED /[] Urgent Care (no appt availability in office) / [] Appointment(In office/virtual)/ []  [facility]/ [] Home Care/ [] Refused Recommended Disposition /[] [facility]/ []  Follow-up with PCP Additional Notes: Agrees with ED.  Reason for Disposition  [1] Blurred vision AND [2] new or worsening  Answer Assessment - Initial Assessment Questions 1. ONSET: "When did the pain start?" (e.g., minutes, hours, days)     Today 2. TIMING: "Does the pain come and go, or has it been constant since it started?" (e.g., constant, intermittent, fleeting)     Constant 3. SEVERITY: "How bad is the pain?"   (Scale 1-10; mild, moderate or severe)   - MILD (1-3): doesn't interfere with normal activities    - MODERATE (4-7): interferes with normal activities or awakens from sleep    - SEVERE (8-10): excruciating pain and patient unable to do normal activities     Severe early but better now 4. LOCATION: "Where does it hurt?"  (e.g., eyelid, eye, cheekbone)     Right eye 5. CAUSE: "What do you think is causing the pain?"     Unsure 6. VISION: "Do you have blurred vision or changes in your vision?"      Blurred 7. EYE DISCHARGE: "Is there any discharge (pus) from the eye(s)?"  If Yes, ask: "What color is it?"      No 8. FEVER: "Do you have a fever?" If Yes, ask: "What is it, how was it measured, and when did it start?"      No 9. OTHER SYMPTOMS: "Do you have any other symptoms?" (e.g., headache, nasal discharge, facial rash)     Headache 10. PREGNANCY: "Is there any chance you are pregnant?" "When was your last menstrual period?"       N/a  Protocols used: Eye Pain and Other Symptoms-A-AH

## 2023-05-12 NOTE — ED Provider Notes (Signed)
[facility] Provider Note    None    (approximate)   History   Visual Field Change   HPI  Isaiah Brown is a 62 y.o. male  here with right vision change. Pt reports that around 11:30 AM, he developed acute onset R visual change. Pt reports that he noticed blurry vision and a "curtain" like change in his R eye, that was initially mild and now is severely blurry throughout his entire eye. No L sided eye changes. He has had some mild R sided HA with it. No slurred speech. No focal numbness or weakness. No recent trauma. No h/o stroke.       Physical Exam   Triage Vital Signs: ED Triage Vitals  Encounter Vitals Group     BP      Systolic BP Percentile      Diastolic BP Percentile      Pulse      Resp      Temp      Temp src      SpO2      Weight      Height      Head Circumference      Peak Flow      Pain Score      Pain Loc      Pain Education      Exclude from Growth Chart     Most recent vital signs: Vitals:   05/12/23 1542  BP: (!) 185/107  Pulse: 67  Temp: 98.5 F (36.9 C)  SpO2: 97%     General: Awake, no distress.  CV:  Good peripheral perfusion. RRR. Resp:  Normal work of breathing. Lungs CTAB. Abd:  No distention. Soft, NT, ND. Other:  CNII-XII individually tested. Face is symmetric. Speech normal. Strength 5/5 bl UE and LE. Normal sensation to light touch.  Ocular exam: R pupil is dilated, sluggishly reactive, with cloudy cornea. Moderate conjunctival injection and pain.   ED Results / Procedures / Treatments   Labs (all labs ordered are listed, but only abnormal results are displayed) Labs Reviewed  CBC - Abnormal; Notable for the following components:      Result Value   WBC 11.0 (*)    Hemoglobin 17.6 (*)    All other components within normal limits  DIFFERENTIAL - Abnormal; Notable for the following components:   Neutro Abs 8.5 (*)    All other components within normal limits  COMPREHENSIVE METABOLIC PANEL  - Abnormal; Notable for the following components:   Glucose, Bld 100 (*)    All other components within normal limits  ETHANOL  PROTIME-INR  APTT  SEDIMENTATION RATE  C-REACTIVE PROTEIN     EKG    RADIOLOGY CT Head: NAICA   I also independently reviewed and agree with radiologist interpretations.   PROCEDURES:  Critical Care performed: Yes, see critical care procedure note(s)  .Critical Care  Performed by: Shaune Pollack, MD Authorized by: Shaune Pollack, MD   Critical care provider statement:    Critical care time (minutes):  30   Critical care time was exclusive of:  Separately billable procedures and treating other patients   Critical care was necessary to treat or prevent imminent or life-threatening deterioration of the following conditions:  CNS failure or compromise   Critical care was time spent personally by me on the following activities:  Development of treatment plan with patient or surrogate, discussions with consultants, evaluation of patient's response to treatment, examination of patient, ordering and  review of laboratory studies, ordering and review of radiographic studies, ordering and performing treatments and interventions, pulse oximetry, re-evaluation of patient's condition and review of old charts     MEDICATIONS ORDERED IN ED: Medications  tetracaine (PONTOCAINE) 0.5 % ophthalmic solution 2 drop (2 drops Right Eye Given by Other 05/12/23 1611)  fentaNYL (SUBLIMAZE) injection 75 mcg (75 mcg Intravenous Given 05/12/23 1602)  ondansetron (ZOFRAN) injection 4 mg (4 mg Intravenous Given 05/12/23 1601)  timolol (TIMOPTIC) 0.5 % ophthalmic solution 1 drop (1 drop Right Eye Given by Other 05/12/23 1632)  latanoprost (XALATAN) 0.005 % ophthalmic solution 1 drop (1 drop Right Eye Given 05/12/23 1632)  dorzolamide (TRUSOPT) 2 % ophthalmic solution 1 drop (1 drop Right Eye Given by Other 05/12/23 1632)  brimonidine (ALPHAGAN) 0.15 % ophthalmic solution 1 drop (1  drop Right Eye Given by Other 05/12/23 1632)     IMPRESSION / MDM / ASSESSMENT AND PLAN / ED COURSE  I reviewed the triage vital signs and the nursing notes.                              Differential diagnosis includes, but is not limited to, CVA, TIA, optic neuritis, RD, vitreous hemorrhage, sinus thrombosis  Patient's presentation is most consistent with acute presentation with potential threat to life or bodily function.  The patient is on the cardiac monitor to evaluate for evidence of arrhythmia and/or significant heart rate changes  62 yo M here with R sided vision loss. Pt initially activated as a CODE STROKE, taken to CT scanner which shows NAICA. 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 He has a dilated, cloudy R eye with IOP of >90 on repeated testing. No known h/o same. Pt given analgesia, IV diamox, and timolol, brimonidine, dorzolamide, latanaoprost with improvement in pain. Case was immediately and intricately discussed with Dr.Hampton. Dr.Hampton requested transport to the [facility] office for definitive laser treatment. He is unable to perform this at [facility].   I had a long discussion with pt. He has been currently stabilized, provided with drops, and pain is controlled. We have arranged medical transport to the eye center where he can receive more definitive treatment with Dr. Rolley Sims. Dr. Rolley Sims is aware and in agreement with this plan. We also arranged for pt to have transport home after procedure, with taxi voucher. Pt in agreement. No other apparent emergent medical condition or alternative etiology for complaints.   FINAL CLINICAL IMPRESSION(S) / ED DIAGNOSES   Final diagnoses:  Acute angle closure     Rx / DC Orders   ED Discharge Orders     None        Note:  This document was prepared using Dragon voice recognition software and may include unintentional dictation errors.   Shaune Pollack, MD 05/13/23 [phone]

## 2023-05-13 DIAGNOSIS — H2513 Age-related nuclear cataract, bilateral: Secondary | ICD-10-CM | POA: Diagnosis not present

## 2023-05-13 DIAGNOSIS — H40211 Acute angle-closure glaucoma, right eye: Secondary | ICD-10-CM | POA: Diagnosis not present

## 2023-05-13 DIAGNOSIS — H18413 Arcus senilis, bilateral: Secondary | ICD-10-CM | POA: Diagnosis not present

## 2023-05-13 DIAGNOSIS — Z9889 Other specified postprocedural states: Secondary | ICD-10-CM | POA: Diagnosis not present

## 2023-05-16 ENCOUNTER — Telehealth: Payer: Self-pay

## 2023-05-16 NOTE — Transitions of Care (Post Inpatient/ED Visit) (Signed)
05/16/2023  Name: Isaiah Brown MRN: [id number] DOB: Aug 01, 1960  Today's TOC FU Call Status: Today's TOC FU Call Status:: Successful TOC FU Call Completed TOC FU Call Complete Date: 05/16/23 Patient's Name and Date of Birth confirmed.  Transition Care Management Follow-up Telephone Call Date of Discharge: 05/12/23 Discharge Facility: [facility]) Type of Discharge: Inpatient Admission Primary Inpatient Discharge Diagnosis:: hypertension How have you been since you were released from the hospital?: Better Any questions or concerns?: No  Items Reviewed: Did you receive and understand the discharge instructions provided?: Yes Medications obtained,verified, and reconciled?: Yes (Medications Reviewed) Any new allergies since your discharge?: No Dietary orders reviewed?: Yes Do you have support at home?: No  Medications Reviewed Today: Medications Reviewed Today     Reviewed by Karena Addison, LPN (Licensed Practical Nurse) on 05/16/23 at 1650  Med List Status: <None>   Medication Order Taking? Sig Documenting Provider Last Dose Status Informant  acetaminophen (TYLENOL) 500 MG tablet [id number]  Take 2,000 mg by mouth every 6 (six) hours as needed for moderate pain. [provider]  Active Self  acetaZOLAMIDE (DIAMOX) 250 MG tablet [id number] Yes Take 250 mg by mouth 2 (two) times daily. [provider]  Active   albuterol (PROVENTIL) (2.5 MG/3ML) 0.083% nebulizer solution [id number]  Take 3 mLs (2.5 mg total) by nebulization every 6 (six) hours as needed for wheezing or shortness of breath. Vigg, Avanti, MD  Active Self  albuterol (VENTOLIN HFA) 108 (90 Base) MCG/ACT inhaler [id number]  Inhale 2 puffs into the lungs every 6 (six) hours as needed for wheezing or shortness of breath. Mecum, Erin E, PA-C  Active   buPROPion (WELLBUTRIN XL) 150 MG 24 hr tablet [id number]  Take 1 tablet (150 mg total) by mouth daily. Larae Grooms, NP  Active    levothyroxine (SYNTHROID) 150 MCG tablet [id number]  Take 1 tablet (150 mcg total) by mouth daily. Mecum, Oswaldo Conroy, PA-C  Active   meloxicam (MOBIC) 7.5 MG tablet [id number]  Take 1 tablet (7.5 mg total) by mouth daily. Larae Grooms, NP  Active   Multiple Vitamins-Minerals (MULTIVITAMIN WITH MINERALS) tablet [id number]  Take 1 tablet by mouth daily. [provider]  Active   rosuvastatin (CRESTOR) 10 MG tablet [id number]  Take 1 tablet (10 mg total) by mouth daily. Larae Grooms, NP  Active   Tiotropium Bromide Monohydrate (SPIRIVA RESPIMAT) 2.5 MCG/ACT AERS [id number]  Inhale 2 puffs into the lungs daily. Mecum, Oswaldo Conroy, PA-C  Active   valsartan-hydrochlorothiazide (DIOVAN-HCT) 160-12.5 MG tablet [id number]  Take 1 tablet by mouth daily. Mecum, Oswaldo Conroy, PA-C  Active             Home Care and Equipment/Supplies: Were Home Health Services Ordered?: NA Any new equipment or medical supplies ordered?: NA  Functional Questionnaire: Do you need assistance with bathing/showering or dressing?: No Do you need assistance with meal preparation?: No Do you need assistance with eating?: No Do you have difficulty maintaining continence: No Do you need assistance with getting out of bed/getting out of a chair/moving?: No Do you have difficulty managing or taking your medications?: No  Follow up appointments reviewed: PCP Follow-up appointment confirmed?: NA Specialist Hospital Follow-up appointment confirmed?: Yes Date of Specialist follow-up appointment?: 05/18/23 Follow-Up Specialty Provider:: [facility] Do you need transportation to your follow-up appointment?: No Do you understand care options if your condition(s) worsen?: Yes-patient verbalized understanding    SIGNATURE Karena Addison, LPN [facility] Nurse Health Advisor Direct Dial [phone]

## 2023-05-20 DIAGNOSIS — Z9889 Other specified postprocedural states: Secondary | ICD-10-CM | POA: Diagnosis not present

## 2023-05-20 DIAGNOSIS — H40211 Acute angle-closure glaucoma, right eye: Secondary | ICD-10-CM | POA: Diagnosis not present

## 2023-05-20 DIAGNOSIS — H40212 Acute angle-closure glaucoma, left eye: Secondary | ICD-10-CM | POA: Diagnosis not present

## 2023-06-20 ENCOUNTER — Encounter: Payer: Self-pay | Admitting: Nurse Practitioner

## 2023-06-20 ENCOUNTER — Ambulatory Visit (INDEPENDENT_AMBULATORY_CARE_PROVIDER_SITE_OTHER): Payer: Medicare Other | Admitting: Nurse Practitioner

## 2023-06-20 VITALS — BP 147/97 | HR 105 | Temp 98.3°F | Ht 73.0 in | Wt 270.4 lb

## 2023-06-20 DIAGNOSIS — Z716 Tobacco abuse counseling: Secondary | ICD-10-CM

## 2023-06-20 DIAGNOSIS — I10 Essential (primary) hypertension: Secondary | ICD-10-CM

## 2023-06-20 DIAGNOSIS — E039 Hypothyroidism, unspecified: Secondary | ICD-10-CM

## 2023-06-20 DIAGNOSIS — D751 Secondary polycythemia: Secondary | ICD-10-CM | POA: Diagnosis not present

## 2023-06-20 MED ORDER — VALSARTAN-HYDROCHLOROTHIAZIDE 320-25 MG PO TABS: 1.0000 | ORAL_TABLET | Freq: Every day | ORAL | 1 refills | Status: DC

## 2023-06-20 MED ORDER — VALSARTAN-HYDROCHLOROTHIAZIDE 160-12.5 MG PO TABS: 2.0000 | ORAL_TABLET | Freq: Every day | ORAL | 1 refills | Status: DC

## 2023-06-20 NOTE — Progress Notes (Signed)
BP (!) 147/97 (BP Location: Left Arm, Patient Position: Sitting, Cuff Size: Large)   Pulse (!) 105   Temp 98.3 F (36.8 C) (Oral)   Ht 6\' 1"  (1.854 m)   Wt 270 lb 6.4 oz (122.7 kg)   SpO2 95%   BMI 35.67 kg/m    Subjective:    Patient ID: Isaiah Brown, male    DOB: Apr 21, 1961, 62 y.o.   MRN: [id number]  HPI: Isaiah Brown is a 62 y.o. male  Chief Complaint  Patient presents with   2 months follow up   Nicotine Dependence   SMOKING CESSATION Patient states he has a love hate relationship with the Wellbutrin.  Patient states when he takes the medication it makes him not want to do anything in his life.  But also helps him not want to smoke.  He hasn't really decreased the amount he is smoking. Smoking Status: Current everyday smoker Smoking Amount: 1 ppd Smoking Onset: 1978 Smoking Quit Date: Not yet Smoking triggers: Type of tobacco use: tobacco Children in the house: no  HYPERTENSION without Chronic Kidney Disease Hypertension status: uncontrolled  Satisfied with current treatment? yes Duration of hypertension: years BP monitoring frequency:  not checking BP range:  BP medication side effects:  no Medication compliance: excellent compliance Previous BP meds:valsartan-HCTZ Aspirin: no Recurrent headaches: no Visual changes: no Palpitations: no Dyspnea: no Chest pain: no Lower extremity edema: no Dizzy/lightheaded: no   Relevant past medical, surgical, family and social history reviewed and updated as indicated. Interim medical history since our last visit reviewed. Allergies and medications reviewed and updated.  Review of Systems  Eyes:  Negative for visual disturbance.  Respiratory:  Negative for shortness of breath.   Cardiovascular:  Negative for chest pain and leg swelling.  Neurological:  Negative for light-headedness and headaches.    Per HPI unless specifically indicated above     Objective:    BP (!) 147/97 (BP Location: Left Arm, Patient  Position: Sitting, Cuff Size: Large)   Pulse (!) 105   Temp 98.3 F (36.8 C) (Oral)   Ht 6\' 1"  (1.854 m)   Wt 270 lb 6.4 oz (122.7 kg)   SpO2 95%   BMI 35.67 kg/m   Wt Readings from Last 3 Encounters:  06/20/23 270 lb 6.4 oz (122.7 kg)  05/12/23 270 lb (122.5 kg)  04/20/23 269 lb (122 kg)    Physical Exam Vitals and nursing note reviewed.  Constitutional:      General: He is not in acute distress.    Appearance: Normal appearance. He is not ill-appearing, toxic-appearing or diaphoretic.  HENT:     Head: Normocephalic.     Right Ear: External ear normal.     Left Ear: External ear normal.     Nose: Nose normal. No congestion or rhinorrhea.     Mouth/Throat:     Mouth: Mucous membranes are moist.  Eyes:     General:        Right eye: No discharge.        Left eye: No discharge.     Extraocular Movements: Extraocular movements intact.     Conjunctiva/sclera: Conjunctivae normal.     Pupils: Pupils are equal, round, and reactive to light.  Cardiovascular:     Rate and Rhythm: Normal rate and regular rhythm.     Heart sounds: No murmur heard. Pulmonary:     Effort: Pulmonary effort is normal. No respiratory distress.     Breath sounds: Normal breath sounds.  No wheezing, rhonchi or rales.  Abdominal:     General: Abdomen is flat. Bowel sounds are normal.  Musculoskeletal:     Cervical back: Normal range of motion and neck supple.  Skin:    General: Skin is warm and dry.     Capillary Refill: Capillary refill takes less than 2 seconds.  Neurological:     General: No focal deficit present.     Mental Status: He is alert and oriented to person, place, and time.  Psychiatric:        Mood and Affect: Mood normal.        Behavior: Behavior normal.        Thought Content: Thought content normal.        Judgment: Judgment normal.     Results for orders placed or performed during the hospital encounter of 05/12/23  Ethanol   Collection Time: 05/12/23  3:51 PM  Result  Value Ref Range   Alcohol, Ethyl (B) <10 <10 mg/dL  Protime-INR   Collection Time: 05/12/23  3:51 PM  Result Value Ref Range   Prothrombin Time 13.2 11.4 - 15.2 seconds   INR 1.0 0.8 - 1.2  APTT   Collection Time: 05/12/23  3:51 PM  Result Value Ref Range   aPTT 36 24 - 36 seconds  CBC   Collection Time: 05/12/23  3:51 PM  Result Value Ref Range   WBC 11.0 (H) 4.0 - 10.5 K/uL   RBC 5.60 4.22 - 5.81 MIL/uL   Hemoglobin 17.6 (H) 13.0 - 17.0 g/dL   HCT 01.0 27.2 - 53.6 %   MCV 92.3 80.0 - 100.0 fL   MCH 31.4 26.0 - 34.0 pg   MCHC 34.0 30.0 - 36.0 g/dL   RDW 64.4 03.4 - 74.2 %   Platelets 216 150 - 400 K/uL   nRBC 0.0 0.0 - 0.2 %  Differential   Collection Time: 05/12/23  3:51 PM  Result Value Ref Range   Neutrophils Relative % 76 %   Neutro Abs 8.5 (H) 1.7 - 7.7 K/uL   Lymphocytes Relative 12 %   Lymphs Abs 1.3 0.7 - 4.0 K/uL   Monocytes Relative 8 %   Monocytes Absolute 0.8 0.1 - 1.0 K/uL   Eosinophils Relative 2 %   Eosinophils Absolute 0.2 0.0 - 0.5 K/uL   Basophils Relative 1 %   Basophils Absolute 0.1 0.0 - 0.1 K/uL   Immature Granulocytes 1 %   Abs Immature Granulocytes 0.06 0.00 - 0.07 K/uL  Comprehensive metabolic panel   Collection Time: 05/12/23  3:51 PM  Result Value Ref Range   Sodium 137 135 - 145 mmol/L   Potassium 3.9 3.5 - 5.1 mmol/L   Chloride 103 98 - 111 mmol/L   CO2 25 22 - 32 mmol/L   Glucose, Bld 100 (H) 70 - 99 mg/dL   BUN 16 8 - 23 mg/dL   Creatinine, Ser 5.95 0.61 - 1.24 mg/dL   Calcium 9.2 8.9 - 63.8 mg/dL   Total Protein 7.6 6.5 - 8.1 g/dL   Albumin 4.2 3.5 - 5.0 g/dL   AST 21 15 - 41 U/L   ALT 16 0 - 44 U/L   Alkaline Phosphatase 49 38 - 126 U/L   Total Bilirubin 1.0 <1.2 mg/dL   GFR, Estimated >75 >64 mL/min   Anion gap 9 5 - 15  Sedimentation rate   Collection Time: 05/12/23  3:51 PM  Result Value Ref Range   Sed Rate 13 0 -  20 mm/hr  C-reactive protein   Collection Time: 05/12/23  3:51 PM  Result Value Ref Range   CRP 0.8  <1.0 mg/dL      Assessment & Plan:   Problem List Items Addressed This Visit       Cardiovascular and Mediastinum   Primary hypertension   Chronic.  Will increase dose of Valsartan and hydrochlorothiazide to 320 and 25mg .  Follow up in 2 months.  Call sooner if concerns arise.       Relevant Medications   valsartan-hydrochlorothiazide (DIOVAN-HCT) 320-25 MG tablet   Other Relevant Orders   CBC w/Diff     Endocrine   Hypothyroidism   Relevant Orders   TSH   T4, free     Other   Encounter for smoking cessation counseling - Primary   Continues to struggle with smoking.  He uses the Wellbutrin which does decrease his want to smoke but also decreases his desire to do anything.  Is working on decreasing his smoking about.          Follow up plan: Return in about 2 months (around 08/21/2023) for BP Check.

## 2023-06-20 NOTE — Assessment & Plan Note (Signed)
Continues to struggle with smoking.  He uses the Wellbutrin which does decrease his want to smoke but also decreases his desire to do anything.  Is working on decreasing his smoking about.

## 2023-06-20 NOTE — Assessment & Plan Note (Signed)
Chronic.  Will increase dose of Valsartan and hydrochlorothiazide to 320 and 25mg .  Follow up in 2 months.  Call sooner if concerns arise.

## 2023-06-21 LAB — CBC WITH DIFFERENTIAL/PLATELET
Basophils Absolute: 0.1 10*3/uL (ref 0.0–0.2)
Basos: 1 %
EOS (ABSOLUTE): 0.2 10*3/uL (ref 0.0–0.4)
Eos: 2 %
Hematocrit: 55 % — ABNORMAL HIGH (ref 37.5–51.0)
Hemoglobin: 18.5 g/dL — ABNORMAL HIGH (ref 13.0–17.7)
Immature Grans (Abs): 0.1 10*3/uL (ref 0.0–0.1)
Immature Granulocytes: 1 %
Lymphocytes Absolute: 1.7 10*3/uL (ref 0.7–3.1)
Lymphs: 16 %
MCH: 31.2 pg (ref 26.6–33.0)
MCHC: 33.6 g/dL (ref 31.5–35.7)
MCV: 93 fL (ref 79–97)
Monocytes Absolute: 1 10*3/uL — ABNORMAL HIGH (ref 0.1–0.9)
Monocytes: 9 %
Neutrophils Absolute: 7.7 10*3/uL — ABNORMAL HIGH (ref 1.4–7.0)
Neutrophils: 71 %
Platelets: 231 10*3/uL (ref 150–450)
RBC: 5.93 x10E6/uL — ABNORMAL HIGH (ref 4.14–5.80)
RDW: 12 % (ref 11.6–15.4)
WBC: 10.7 10*3/uL (ref 3.4–10.8)

## 2023-06-21 LAB — T4, FREE: Free T4: 1.46 ng/dL (ref 0.82–1.77)

## 2023-06-21 LAB — TSH: TSH: 1.47 u[IU]/mL (ref 0.450–4.500)

## 2023-06-21 NOTE — Addendum Note (Signed)
Addended by: Larae Grooms on: 06/21/2023 08:10 AM   Modules accepted: Orders

## 2023-06-23 ENCOUNTER — Telehealth: Payer: Self-pay | Admitting: Internal Medicine

## 2023-06-23 NOTE — Telephone Encounter (Signed)
Called patient to schedule MD: labs- cbc/bmp- possible Phlebotomy. Left voicemail for patient to call back.

## 2023-06-27 ENCOUNTER — Other Ambulatory Visit: Payer: Medicare Other

## 2023-06-27 ENCOUNTER — Ambulatory Visit: Payer: Medicare Other | Admitting: Internal Medicine

## 2023-07-04 ENCOUNTER — Encounter: Payer: Self-pay | Admitting: Nurse Practitioner

## 2023-07-05 DIAGNOSIS — Z9889 Other specified postprocedural states: Secondary | ICD-10-CM | POA: Diagnosis not present

## 2023-07-05 DIAGNOSIS — H40212 Acute angle-closure glaucoma, left eye: Secondary | ICD-10-CM | POA: Diagnosis not present

## 2023-07-05 DIAGNOSIS — H40211 Acute angle-closure glaucoma, right eye: Secondary | ICD-10-CM | POA: Diagnosis not present

## 2023-08-10 ENCOUNTER — Other Ambulatory Visit: Payer: Self-pay | Admitting: Physician Assistant

## 2023-08-11 ENCOUNTER — Other Ambulatory Visit: Payer: Self-pay | Admitting: Nurse Practitioner

## 2023-08-11 MED ORDER — BUPROPION HCL ER (XL) 150 MG PO TB24: 150.0000 mg | ORAL_TABLET | Freq: Every day | ORAL | 1 refills | Status: DC

## 2023-08-11 NOTE — Telephone Encounter (Signed)
 Patient walked in a requested refills of his levothyroxine  and Wellbutrin .  Sent today.

## 2023-08-12 NOTE — Telephone Encounter (Signed)
 Requested Prescriptions  Refused Prescriptions Disp Refills   buPROPion  (WELLBUTRIN  XL) 150 MG 24 hr tablet [Pharmacy Med Name: BUPROPION  HCL XL 150 MG TABLET] 60 tablet 0    Sig: Take 1 tablet (150 mg total) by mouth daily.     Psychiatry: Antidepressants - bupropion  Failed - 08/12/2023  9:57 AM      Failed - Last BP in normal range    BP Readings from Last 1 Encounters:  06/20/23 (!) 147/97         Passed - Cr in normal range and within 360 days    Creatinine, Ser  Date Value Ref Range Status  05/12/2023 1.05 0.61 - 1.24 mg/dL Final         Passed - AST in normal range and within 360 days    AST  Date Value Ref Range Status  05/12/2023 21 15 - 41 U/L Final         Passed - ALT in normal range and within 360 days    ALT  Date Value Ref Range Status  05/12/2023 16 0 - 44 U/L Final         Passed - Valid encounter within last 6 months    Recent Outpatient Visits           1 month ago Encounter for smoking cessation counseling   [place] [facility] Melvin Pao, NP   3 months ago Need for influenza vaccination   [place] [facility] Melvin Pao, NP   4 months ago Primary hypertension   [place] [facility] Mecum, Rocky BRAVO, PA-C   11 months ago [facility] to [employer] preventive visit   [place] [facility] Melvin Pao, NP   1 year ago Hordeolum externum of right upper eyelid   [facility] Melvin Pao, NP       Future Appointments             In 3 weeks Melvin Pao, NP [place] [facility]

## 2023-09-05 ENCOUNTER — Ambulatory Visit: Payer: Medicare Other | Admitting: Nurse Practitioner

## 2023-09-05 ENCOUNTER — Encounter: Payer: Self-pay | Admitting: Nurse Practitioner

## 2023-09-05 VITALS — BP 127/85 | HR 76 | Temp 98.9°F | Resp 18 | Ht 72.99 in | Wt 276.4 lb

## 2023-09-05 DIAGNOSIS — I10 Essential (primary) hypertension: Secondary | ICD-10-CM

## 2023-09-05 NOTE — Assessment & Plan Note (Signed)
 Chronic.  Will increase dose of Valsartan and hydrochlorothiazide to 320 and 25mg .  Follow up in 4 months.  Call sooner if concerns arise.

## 2023-09-05 NOTE — Progress Notes (Signed)
 BP 127/85 (BP Location: Right Arm, Patient Position: Sitting, Cuff Size: Large)   Pulse 76   Temp 98.9 F (37.2 C) (Oral)   Resp 18   Ht 6' 0.99" (1.854 m)   Wt 276 lb 6.4 oz (125.4 kg)   SpO2 96%   BMI 36.47 kg/m    Subjective:    Patient ID: Isaiah Brown, male    DOB: 09-Apr-1961, 63 y.o.   MRN: [id number]  HPI: Isaiah Brown is a 63 y.o. male  Chief Complaint  Patient presents with   Hypertension   Pain medication    Thoughts on Tylenol and Meloxicam as he uses this for pain control. Needs a regimen because of his back but wants to be sure its ok to continue the use. Meloxicam dose is 7.5 mg daily.    Tinnitus    Today its really bad. Questions if related to Covid vaccine.    HYPERTENSION without Chronic Kidney Disease Hypertension status: controlled  Satisfied with current treatment? no Duration of hypertension: years BP monitoring frequency:  not checking BP range:  BP medication side effects:  no Medication compliance: excellent compliance Previous BP meds:valsartan-HCTZ Aspirin: no Recurrent headaches: no Visual changes: no Palpitations: no Dyspnea: no Chest pain: no Lower extremity edema: no Dizzy/lightheaded: no    Relevant past medical, surgical, family and social history reviewed and updated as indicated. Interim medical history since our last visit reviewed. Allergies and medications reviewed and updated.  Review of Systems  Eyes:  Negative for visual disturbance.  Respiratory:  Negative for shortness of breath.   Cardiovascular:  Negative for chest pain and leg swelling.  Neurological:  Negative for light-headedness and headaches.    Per HPI unless specifically indicated above     Objective:    BP 127/85 (BP Location: Right Arm, Patient Position: Sitting, Cuff Size: Large)   Pulse 76   Temp 98.9 F (37.2 C) (Oral)   Resp 18   Ht 6' 0.99" (1.854 m)   Wt 276 lb 6.4 oz (125.4 kg)   SpO2 96%   BMI 36.47 kg/m   Wt Readings from Last 3  Encounters:  09/05/23 276 lb 6.4 oz (125.4 kg)  06/20/23 270 lb 6.4 oz (122.7 kg)  05/12/23 270 lb (122.5 kg)    Physical Exam Vitals and nursing note reviewed.  Constitutional:      General: He is not in acute distress.    Appearance: Normal appearance. He is not ill-appearing, toxic-appearing or diaphoretic.  HENT:     Head: Normocephalic.     Right Ear: External ear normal.     Left Ear: External ear normal.     Nose: Nose normal. No congestion or rhinorrhea.     Mouth/Throat:     Mouth: Mucous membranes are moist.  Eyes:     General:        Right eye: No discharge.        Left eye: No discharge.     Extraocular Movements: Extraocular movements intact.     Conjunctiva/sclera: Conjunctivae normal.     Pupils: Pupils are equal, round, and reactive to light.  Cardiovascular:     Rate and Rhythm: Normal rate and regular rhythm.     Heart sounds: No murmur heard. Pulmonary:     Effort: Pulmonary effort is normal. No respiratory distress.     Breath sounds: Normal breath sounds. No wheezing, rhonchi or rales.  Abdominal:     General: Abdomen is flat. Bowel sounds are normal.  Musculoskeletal:     Cervical back: Normal range of motion and neck supple.  Skin:    General: Skin is warm and dry.     Capillary Refill: Capillary refill takes less than 2 seconds.  Neurological:     General: No focal deficit present.     Mental Status: He is alert and oriented to person, place, and time.  Psychiatric:        Mood and Affect: Mood normal.        Behavior: Behavior normal.        Thought Content: Thought content normal.        Judgment: Judgment normal.     Results for orders placed or performed in visit on 06/20/23  TSH   Collection Time: 06/20/23  1:56 PM  Result Value Ref Range   TSH 1.470 0.450 - 4.500 uIU/mL  T4, free   Collection Time: 06/20/23  1:56 PM  Result Value Ref Range   Free T4 1.46 0.82 - 1.77 ng/dL  CBC w/Diff   Collection Time: 06/20/23  1:56 PM  Result  Value Ref Range   WBC 10.7 3.4 - 10.8 x10E3/uL   RBC 5.93 (H) 4.14 - 5.80 x10E6/uL   Hemoglobin 18.5 (H) 13.0 - 17.7 g/dL   Hematocrit 16.1 (H) 09.6 - 51.0 %   MCV 93 79 - 97 fL   MCH 31.2 26.6 - 33.0 pg   MCHC 33.6 31.5 - 35.7 g/dL   RDW 04.5 40.9 - 81.1 %   Platelets 231 150 - 450 x10E3/uL   Neutrophils 71 Not Estab. %   Lymphs 16 Not Estab. %   Monocytes 9 Not Estab. %   Eos 2 Not Estab. %   Basos 1 Not Estab. %   Neutrophils Absolute 7.7 (H) 1.4 - 7.0 x10E3/uL   Lymphocytes Absolute 1.7 0.7 - 3.1 x10E3/uL   Monocytes Absolute 1.0 (H) 0.1 - 0.9 x10E3/uL   EOS (ABSOLUTE) 0.2 0.0 - 0.4 x10E3/uL   Basophils Absolute 0.1 0.0 - 0.2 x10E3/uL   Immature Granulocytes 1 Not Estab. %   Immature Grans (Abs) 0.1 0.0 - 0.1 x10E3/uL      Assessment & Plan:   Problem List Items Addressed This Visit       Cardiovascular and Mediastinum   Primary hypertension - Primary   Chronic.  Will increase dose of Valsartan and hydrochlorothiazide to 320 and 25mg .  Follow up in 4 months.  Call sooner if concerns arise.         Follow up plan: No follow-ups on file.

## 2023-11-23 ENCOUNTER — Other Ambulatory Visit: Payer: Self-pay | Admitting: Nurse Practitioner

## 2023-11-24 NOTE — Telephone Encounter (Signed)
 Requested Prescriptions  Pending Prescriptions Disp Refills   meloxicam  (MOBIC ) 7.5 MG tablet [Pharmacy Med Name: MELOXICAM  7.5 MG TABLET] 30 tablet 2    Sig: Take 1 tablet (7.5 mg total) by mouth daily.     Analgesics:  COX2 Inhibitors Failed - 11/24/2023  4:55 PM      Failed - Manual Review: Labs are only required if the patient has taken medication for more than 8 weeks.      Failed - HGB in normal range and within 360 days    Hemoglobin  Date Value Ref Range Status  06/20/2023 18.5 (H) 13.0 - 17.7 g/dL Final         Failed - HCT in normal range and within 360 days    Hematocrit  Date Value Ref Range Status  06/20/2023 55.0 (H) 37.5 - 51.0 % Final         Passed - Cr in normal range and within 360 days    Creatinine, Ser  Date Value Ref Range Status  05/12/2023 1.05 0.61 - 1.24 mg/dL Final         Passed - AST in normal range and within 360 days    AST  Date Value Ref Range Status  05/12/2023 21 15 - 41 U/L Final         Passed - ALT in normal range and within 360 days    ALT  Date Value Ref Range Status  05/12/2023 16 0 - 44 U/L Final         Passed - eGFR is 30 or above and within 360 days    GFR, Estimated  Date Value Ref Range Status  05/12/2023 >60 >60 mL/min Final    Comment:    (NOTE) Calculated using the CKD-EPI Creatinine Equation (2021)    eGFR  Date Value Ref Range Status  03/16/2023 70 >59 mL/min/1.73 Final         Passed - Patient is not pregnant      Passed - Valid encounter within last 12 months    Recent Outpatient Visits           2 months ago Primary hypertension   [place] [facility] Aileen Alexanders, NP       Future Appointments             In 1 month Aileen Alexanders, NP [place] [facility]

## 2024-01-05 ENCOUNTER — Ambulatory Visit: Admitting: Nurse Practitioner

## 2024-01-05 NOTE — Progress Notes (Deleted)
 There were no vitals taken for this visit.   Subjective:    Patient ID: Isaiah Brown, male    DOB: 1961-03-02, 63 y.o.   MRN: [id number]  HPI: Isaiah Brown is a 63 y.o. male  No chief complaint on file.  HYPERTENSION without Chronic Kidney Disease Hypertension status: controlled  Satisfied with current treatment? no Duration of hypertension: years BP monitoring frequency:  not checking BP range:  BP medication side effects:  no Medication compliance: excellent compliance Previous BP meds:valsartan -HCTZ Aspirin: no Recurrent headaches: no Visual changes: no Palpitations: no Dyspnea: no Chest pain: no Lower extremity edema: no Dizzy/lightheaded: no  HYPOTHYROIDISM Thyroid  control status:{Blank single:19197::controlled,uncontrolled,better,worse,exacerbated,stable} Satisfied with current treatment? {Blank single:19197::yes,no} Medication side effects: {Blank single:19197::yes,no} Medication compliance: {Blank single:19197::excellent compliance,good compliance,fair compliance,poor compliance} Etiology of hypothyroidism:  Recent dose adjustment:{Blank single:19197::yes,no} Fatigue: {Blank single:19197::yes,no} Cold intolerance: {Blank single:19197::yes,no} Heat intolerance: {Blank single:19197::yes,no} Weight gain: {Blank single:19197::yes,no} Weight loss: {Blank single:19197::yes,no} Constipation: {Blank single:19197::yes,no} Diarrhea/loose stools: {Blank single:19197::yes,no} Palpitations: {Blank single:19197::yes,no} Lower extremity edema: {Blank single:19197::yes,no} Anxiety/depressed mood: {Blank single:19197::yes,no}   Relevant past medical, surgical, family and social history reviewed and updated as indicated. Interim medical history since our last visit reviewed. Allergies and medications reviewed and updated.  Review of Systems  Eyes:  Negative for visual disturbance.  Respiratory:   Negative for shortness of breath.   Cardiovascular:  Negative for chest pain and leg swelling.  Neurological:  Negative for light-headedness and headaches.    Per HPI unless specifically indicated above     Objective:    There were no vitals taken for this visit.  Wt Readings from Last 3 Encounters:  09/05/23 276 lb 6.4 oz (125.4 kg)  06/20/23 270 lb 6.4 oz (122.7 kg)  05/12/23 270 lb (122.5 kg)    Physical Exam Vitals and nursing note reviewed.  Constitutional:      General: He is not in acute distress.    Appearance: Normal appearance. He is not ill-appearing, toxic-appearing or diaphoretic.  HENT:     Head: Normocephalic.     Right Ear: External ear normal.     Left Ear: External ear normal.     Nose: Nose normal. No congestion or rhinorrhea.     Mouth/Throat:     Mouth: Mucous membranes are moist.  Eyes:     General:        Right eye: No discharge.        Left eye: No discharge.     Extraocular Movements: Extraocular movements intact.     Conjunctiva/sclera: Conjunctivae normal.     Pupils: Pupils are equal, round, and reactive to light.  Cardiovascular:     Rate and Rhythm: Normal rate and regular rhythm.     Heart sounds: No murmur heard. Pulmonary:     Effort: Pulmonary effort is normal. No respiratory distress.     Breath sounds: Normal breath sounds. No wheezing, rhonchi or rales.  Abdominal:     General: Abdomen is flat. Bowel sounds are normal.  Musculoskeletal:     Cervical back: Normal range of motion and neck supple.  Skin:    General: Skin is warm and dry.     Capillary Refill: Capillary refill takes less than 2 seconds.  Neurological:     General: No focal deficit present.     Mental Status: He is alert and oriented to person, place, and time.  Psychiatric:        Mood and Affect: Mood normal.        Behavior: Behavior  normal.        Thought Content: Thought content normal.        Judgment: Judgment normal.     Results for orders placed or  performed in visit on 06/20/23  TSH   Collection Time: 06/20/23  1:56 PM  Result Value Ref Range   TSH 1.470 0.450 - 4.500 uIU/mL  T4, free   Collection Time: 06/20/23  1:56 PM  Result Value Ref Range   Free T4 1.46 0.82 - 1.77 ng/dL  CBC w/Diff   Collection Time: 06/20/23  1:56 PM  Result Value Ref Range   WBC 10.7 3.4 - 10.8 x10E3/uL   RBC 5.93 (H) 4.14 - 5.80 x10E6/uL   Hemoglobin 18.5 (H) 13.0 - 17.7 g/dL   Hematocrit 44.9 (H) 62.4 - 51.0 %   MCV 93 79 - 97 fL   MCH 31.2 26.6 - 33.0 pg   MCHC 33.6 31.5 - 35.7 g/dL   RDW 87.9 88.3 - 84.5 %   Platelets 231 150 - 450 x10E3/uL   Neutrophils 71 Not Estab. %   Lymphs 16 Not Estab. %   Monocytes 9 Not Estab. %   Eos 2 Not Estab. %   Basos 1 Not Estab. %   Neutrophils Absolute 7.7 (H) 1.4 - 7.0 x10E3/uL   Lymphocytes Absolute 1.7 0.7 - 3.1 x10E3/uL   Monocytes Absolute 1.0 (H) 0.1 - 0.9 x10E3/uL   EOS (ABSOLUTE) 0.2 0.0 - 0.4 x10E3/uL   Basophils Absolute 0.1 0.0 - 0.2 x10E3/uL   Immature Granulocytes 1 Not Estab. %   Immature Grans (Abs) 0.1 0.0 - 0.1 x10E3/uL      Assessment & Plan:   Problem List Items Addressed This Visit       Cardiovascular and Mediastinum   Aortic atherosclerosis (HCC) - Primary     Respiratory   Emphysema of lung (HCC)     Follow up plan: No follow-ups on file.

## 2024-01-12 ENCOUNTER — Other Ambulatory Visit: Payer: Self-pay | Admitting: Nurse Practitioner

## 2024-01-13 NOTE — Telephone Encounter (Signed)
 Requested medication (s) are due for refill today: yes  Requested medication (s) are on the active medication list: yes  Last refill:  06/20/23 #90/1  Future visit scheduled: yes  Notes to clinic:  Unable to refill per protocol due to failed labs, no updated results.      Requested Prescriptions  Pending Prescriptions Disp Refills   valsartan -hydrochlorothiazide  (DIOVAN -HCT) 320-25 MG tablet [Pharmacy Med Name: VALSARTAN -HCTZ 320-25 MG TAB] 90 tablet 0    Sig: Take 1 tablet by mouth daily.     Cardiovascular: ARB + Diuretic Combos Failed - 01/13/2024  3:14 PM      Failed - K in normal range and within 180 days    Potassium  Date Value Ref Range Status  05/12/2023 3.9 3.5 - 5.1 mmol/L Final         Failed - Na in normal range and within 180 days    Sodium  Date Value Ref Range Status  05/12/2023 137 135 - 145 mmol/L Final  03/16/2023 140 134 - 144 mmol/L Final         Failed - Cr in normal range and within 180 days    Creatinine, Ser  Date Value Ref Range Status  05/12/2023 1.05 0.61 - 1.24 mg/dL Final         Failed - eGFR is 10 or above and within 180 days    GFR, Estimated  Date Value Ref Range Status  05/12/2023 >60 >60 mL/min Final    Comment:    (NOTE) Calculated using the CKD-EPI Creatinine Equation (2021)    eGFR  Date Value Ref Range Status  03/16/2023 70 >59 mL/min/1.73 Final         Passed - Patient is not pregnant      Passed - Last BP in normal range    BP Readings from Last 1 Encounters:  09/05/23 127/85         Passed - Valid encounter within last 6 months    Recent Outpatient Visits           4 months ago Primary hypertension   [place] [facility] Melvin Pao, NP

## 2024-01-13 NOTE — Telephone Encounter (Signed)
Patient will be out before appointment next week

## 2024-01-18 ENCOUNTER — Ambulatory Visit: Admitting: Nurse Practitioner

## 2024-01-18 ENCOUNTER — Encounter: Payer: Self-pay | Admitting: Nurse Practitioner

## 2024-01-18 VITALS — BP 122/80 | HR 85 | Temp 97.6°F | Wt 265.0 lb

## 2024-01-18 DIAGNOSIS — I700 Atherosclerosis of aorta: Secondary | ICD-10-CM | POA: Diagnosis not present

## 2024-01-18 DIAGNOSIS — E039 Hypothyroidism, unspecified: Secondary | ICD-10-CM | POA: Diagnosis not present

## 2024-01-18 DIAGNOSIS — M25552 Pain in left hip: Secondary | ICD-10-CM

## 2024-01-18 DIAGNOSIS — E785 Hyperlipidemia, unspecified: Secondary | ICD-10-CM | POA: Diagnosis not present

## 2024-01-18 DIAGNOSIS — J439 Emphysema, unspecified: Secondary | ICD-10-CM | POA: Diagnosis not present

## 2024-01-18 DIAGNOSIS — I10 Essential (primary) hypertension: Secondary | ICD-10-CM

## 2024-01-18 MED ORDER — ROSUVASTATIN CALCIUM 10 MG PO TABS: 10.0000 mg | ORAL_TABLET | Freq: Every day | ORAL | 1 refills | Status: DC

## 2024-01-18 MED ORDER — LEVOTHYROXINE SODIUM 150 MCG PO TABS: 150.0000 ug | ORAL_TABLET | Freq: Every day | ORAL | 1 refills | Status: DC

## 2024-01-18 MED ORDER — VALSARTAN-HYDROCHLOROTHIAZIDE 320-25 MG PO TABS: 1.0000 | ORAL_TABLET | Freq: Every day | ORAL | 1 refills | Status: DC

## 2024-01-18 NOTE — Assessment & Plan Note (Signed)
 Chronic, ongoing He reports he ran out of Levothyroxine  for about a week Recheck TSH and T4 today  Refills provided of Levothyroxine  Results of lab work to dictate further management Follow up in 6 months or sooner if concerns arise

## 2024-01-18 NOTE — Assessment & Plan Note (Signed)
 Chronic.  Controlled.  Continue with current medication regimen of Atorvastatin daily.  Labs ordered today.  Return to clinic in 6 months for reevaluation.  Call sooner if concerns arise.

## 2024-01-18 NOTE — Progress Notes (Signed)
 BP 122/80   Pulse 85   Temp 97.6 F (36.4 C) (Oral)   Wt 265 lb (120.2 kg)   SpO2 96%   BMI 34.97 kg/m    Subjective:    Patient ID: Isaiah Brown, male    DOB: 1960-09-29, 63 y.o.   MRN: [id number]  HPI: Isaiah Brown is a 63 y.o. male  Chief Complaint  Patient presents with   Hip Pain    Left hip pain   HYPERTENSION without Chronic Kidney Disease Hypertension status: controlled  Satisfied with current treatment? no Duration of hypertension: years BP monitoring frequency:  not checking BP range:  BP medication side effects:  no Medication compliance: excellent compliance Previous BP meds:valsartan -HCTZ Aspirin: no Recurrent headaches: no Visual changes: no Palpitations: no Dyspnea: no Chest pain: no Lower extremity edema: no Dizzy/lightheaded: no  HYPOTHYROIDISM Thyroid  control status:controlled Satisfied with current treatment? yes Medication side effects: no Medication compliance: excellent compliance Etiology of hypothyroidism:  Recent dose adjustment:no Fatigue: no Cold intolerance: no Heat intolerance: no Weight gain: no Weight loss: no Constipation: no Diarrhea/loose stools: no Palpitations: no Lower extremity edema: no Anxiety/depressed mood: no  He hasn't smoked in 6 days.  He is using the Lozenges. He does feel like they are helping.  He is also using the Wellbutrin .  He is still using Spiriva .  Patient states he is having some left hip pain.  He had to walk about a mile and from there his hip really started hurting.    Relevant past medical, surgical, family and social history reviewed and updated as indicated. Interim medical history since our last visit reviewed. Allergies and medications reviewed and updated.  Review of Systems  Eyes:  Negative for visual disturbance.  Respiratory:  Negative for shortness of breath.   Cardiovascular:  Negative for chest pain and leg swelling.  Neurological:  Negative for light-headedness and  headaches.    Per HPI unless specifically indicated above     Objective:    BP 122/80   Pulse 85   Temp 97.6 F (36.4 C) (Oral)   Wt 265 lb (120.2 kg)   SpO2 96%   BMI 34.97 kg/m   Wt Readings from Last 3 Encounters:  01/18/24 265 lb (120.2 kg)  09/05/23 276 lb 6.4 oz (125.4 kg)  06/20/23 270 lb 6.4 oz (122.7 kg)    Physical Exam Vitals and nursing note reviewed.  Constitutional:      General: He is not in acute distress.    Appearance: Normal appearance. He is not ill-appearing, toxic-appearing or diaphoretic.  HENT:     Head: Normocephalic.     Right Ear: External ear normal.     Left Ear: External ear normal.     Nose: Nose normal. No congestion or rhinorrhea.     Mouth/Throat:     Mouth: Mucous membranes are moist.  Eyes:     General:        Right eye: No discharge.        Left eye: No discharge.     Extraocular Movements: Extraocular movements intact.     Conjunctiva/sclera: Conjunctivae normal.     Pupils: Pupils are equal, round, and reactive to light.  Cardiovascular:     Rate and Rhythm: Normal rate and regular rhythm.     Heart sounds: No murmur heard. Pulmonary:     Effort: Pulmonary effort is normal. No respiratory distress.     Breath sounds: Normal breath sounds. No wheezing, rhonchi or rales.  Abdominal:  General: Abdomen is flat. Bowel sounds are normal.  Musculoskeletal:     Cervical back: Normal range of motion and neck supple.  Skin:    General: Skin is warm and dry.     Capillary Refill: Capillary refill takes less than 2 seconds.  Neurological:     General: No focal deficit present.     Mental Status: He is alert and oriented to person, place, and time.  Psychiatric:        Mood and Affect: Mood normal.        Behavior: Behavior normal.        Thought Content: Thought content normal.        Judgment: Judgment normal.     Results for orders placed or performed in visit on 06/20/23  TSH   Collection Time: 06/20/23  1:56 PM   Result Value Ref Range   TSH 1.470 0.450 - 4.500 uIU/mL  T4, free   Collection Time: 06/20/23  1:56 PM  Result Value Ref Range   Free T4 1.46 0.82 - 1.77 ng/dL  CBC w/Diff   Collection Time: 06/20/23  1:56 PM  Result Value Ref Range   WBC 10.7 3.4 - 10.8 x10E3/uL   RBC 5.93 (H) 4.14 - 5.80 x10E6/uL   Hemoglobin 18.5 (H) 13.0 - 17.7 g/dL   Hematocrit 44.9 (H) 62.4 - 51.0 %   MCV 93 79 - 97 fL   MCH 31.2 26.6 - 33.0 pg   MCHC 33.6 31.5 - 35.7 g/dL   RDW 87.9 88.3 - 84.5 %   Platelets 231 150 - 450 x10E3/uL   Neutrophils 71 Not Estab. %   Lymphs 16 Not Estab. %   Monocytes 9 Not Estab. %   Eos 2 Not Estab. %   Basos 1 Not Estab. %   Neutrophils Absolute 7.7 (H) 1.4 - 7.0 x10E3/uL   Lymphocytes Absolute 1.7 0.7 - 3.1 x10E3/uL   Monocytes Absolute 1.0 (H) 0.1 - 0.9 x10E3/uL   EOS (ABSOLUTE) 0.2 0.0 - 0.4 x10E3/uL   Basophils Absolute 0.1 0.0 - 0.2 x10E3/uL   Immature Granulocytes 1 Not Estab. %   Immature Grans (Abs) 0.1 0.0 - 0.1 x10E3/uL      Assessment & Plan:   Problem List Items Addressed This Visit       Cardiovascular and Mediastinum   Primary hypertension   Aortic atherosclerosis (HCC) - Primary     Respiratory   Emphysema of lung (HCC)     Endocrine   Hypothyroidism     Other   Hyperlipidemia     Follow up plan: No follow-ups on file.

## 2024-01-18 NOTE — Assessment & Plan Note (Signed)
 Chronic.  Will increase dose of Valsartan  and hydrochlorothiazide  to 320 and 25mg .  Refills sent today.  Labs ordered at visit today.  Follow up in 6 months.  Call sooner if concerns arise.

## 2024-01-18 NOTE — Assessment & Plan Note (Signed)
Chronic.  Controlled.  Continue with current medication regimen of Crestor.  Labs ordered today.  Return to clinic in 6 months for reevaluation.  Call sooner if concerns arise.   

## 2024-01-18 NOTE — Assessment & Plan Note (Signed)
 Chronic, historic condition Has now gone 6 days without smoking He denies concerns today  He is taking Spiriva  2.5 mcg as directed and has rescue inhaler as needed Continue with current regimen Follow up in 6 months or sooner if concerns arise

## 2024-01-19 ENCOUNTER — Ambulatory Visit (INDEPENDENT_AMBULATORY_CARE_PROVIDER_SITE_OTHER): Admitting: Emergency Medicine

## 2024-01-19 VITALS — Ht 73.0 in | Wt 265.0 lb

## 2024-01-19 DIAGNOSIS — Z0000 Encounter for general adult medical examination without abnormal findings: Secondary | ICD-10-CM | POA: Diagnosis not present

## 2024-01-19 DIAGNOSIS — Z1211 Encounter for screening for malignant neoplasm of colon: Secondary | ICD-10-CM

## 2024-01-19 DIAGNOSIS — Z87891 Personal history of nicotine dependence: Secondary | ICD-10-CM

## 2024-01-19 NOTE — Patient Instructions (Signed)
 Mr. Smaldone , Thank you for taking time out of your busy schedule to complete your Annual Wellness Visit with me. I enjoyed our conversation and look forward to speaking with you again next year. I, as well as your care team,  appreciate your ongoing commitment to your health goals. Please review the following plan we discussed and let me know if I can assist you in the future. Your Game plan/ To Do List    Referrals: If you haven't heard from the office you've been referred to, please reach out to them at the phone provided.          Gastroenterology (GI) for colon cancer screening (colonoscopy)        [facility]         [address]         [address]         [place], [place] [address]         [phone]          Follow up Visits: Next Medicare AWV with our clinical staff: 01/31/25 @ 3:50pm (IN-PERSON VISIT)   Have you seen your provider in the last 6 months (3 months if uncontrolled diabetes)? Yes Next Office Visit with your provider: 07/24/24 @ 2:00pm with Darice Petty, NP  Clinician Recommendations: I have placed an order for you to get a low dose lung CT scan to screen for lung cancer. Please call [phone] to schedule at your earliest convenience. Get tetanus shot at your local pharmacy at your convenience. Get Covid and flu vaccines in the fall.  Aim for 30 minutes of exercise or brisk walking, 6-8 glasses of water , and 5 servings of fruits and vegetables each day.       This is a list of the screening recommended for you and due dates:  Health Maintenance  Topic Date Due   Screening for Lung Cancer  11/14/2021   COVID-19 Vaccine (5 - 2024-25 season) 03/06/2023   DTaP/Tdap/Td vaccine (1 - Tdap) 04/19/2024*   Flu Shot  02/03/2024   Medicare Annual Wellness Visit  01/18/2025   Cologuard (Stool DNA test)  10/05/2025   Pneumococcal Vaccine for age over 42  Completed   Hepatitis C Screening  Completed   HIV Screening  Completed    Zoster (Shingles) Vaccine  Completed   Hepatitis B Vaccine  Aged Out   HPV Vaccine  Aged Out   Meningitis B Vaccine  Aged Out  *Topic was postponed. The date shown is not the original due date.    Advanced directives: (ACP Link)Information on Advanced Care Planning can be found at [place]  Secretary of [facility] Advance Health Care Directives Advance Health Care Directives. [email or web address] You may also get the forms at your doctor's office Advance Care Planning is important because it:  [x]  Makes sure you receive the medical care that is consistent with your values, goals, and preferences  [x]  It provides guidance to your family and loved ones and reduces their decisional burden about whether or not they are making the right decisions based on your wishes.  Follow the link provided in your after visit summary or read over the paperwork we have mailed to you to help you started getting your Advance Directives in place. If you need assistance in completing these, please reach out to us  so that we can help you!  See attachments for Preventive Care and Fall Prevention Tips.   Fall Prevention in the Home, Adult Falls  can cause injuries and affect people of all ages. There are many simple things that you can do to make your home safe and to help prevent falls. If you need it, ask for help making these changes. What actions can I take to prevent falls? General information Use good lighting in all rooms. Make sure to: Replace any light bulbs that burn out. Turn on lights if it is dark and use night-lights. Keep items that you use often in easy-to-reach places. Lower the shelves around your home if needed. Move furniture so that there are clear paths around it. Do not keep throw rugs or other things on the floor that can make you trip. If any of your floors are uneven, fix them. Add color or contrast paint or tape to clearly mark and help you see: Grab bars or handrails. First and last steps of  staircases. Where the edge of each step is. If you use a ladder or stepladder: Make sure that it is fully opened. Do not climb a closed ladder. Make sure the sides of the ladder are locked in place. Have someone hold the ladder while you use it. Know where your pets are as you move through your home. What can I do in the bathroom?     Keep the floor dry. Clean up any water  that is on the floor right away. Remove soap buildup in the bathtub or shower. Buildup makes bathtubs and showers slippery. Use non-skid mats or decals on the floor of the bathtub or shower. Attach bath mats securely with double-sided, non-slip rug tape. If you need to sit down while you are in the shower, use a non-slip stool. Install grab bars by the toilet and in the bathtub and shower. Do not use towel bars as grab bars. What can I do in the bedroom? Make sure that you have a light by your bed that is easy to reach. Do not use any sheets or blankets on your bed that hang to the floor. Have a firm bench or chair with side arms that you can use for support when you get dressed. What can I do in the kitchen? Clean up any spills right away. If you need to reach something above you, use a sturdy step stool that has a grab bar. Keep electrical cables out of the way. Do not use floor polish or wax that makes floors slippery. What can I do with my stairs? Do not leave anything on the stairs. Make sure that you have a light switch at the top and the bottom of the stairs. Have them installed if you do not have them. Make sure that there are handrails on both sides of the stairs. Fix handrails that are broken or loose. Make sure that handrails are as long as the staircases. Install non-slip stair treads on all stairs in your home if they do not have carpet. Avoid having throw rugs at the top or bottom of stairs, or secure the rugs with carpet tape to prevent them from moving. Choose a carpet design that does not hide the  edge of steps on the stairs. Make sure that carpet is firmly attached to the stairs. Fix any carpet that is loose or worn. What can I do on the outside of my home? Use bright outdoor lighting. Repair the edges of walkways and driveways and fix any cracks. Clear paths of anything that can make you trip, such as tools or rocks. Add color or contrast paint or tape  to clearly mark and help you see high doorway thresholds. Trim any bushes or trees on the main path into your home. Check that handrails are securely fastened and in good repair. Both sides of all steps should have handrails. Install guardrails along the edges of any raised decks or porches. Have leaves, snow, and ice cleared regularly. Use sand, salt, or ice melt on walkways during winter months if you live where there is ice and snow. In the garage, clean up any spills right away, including grease or oil spills. What other actions can I take? Review your medicines with your health care provider. Some medicines can make you confused or feel dizzy. This can increase your chance of falling. Wear closed-toe shoes that fit well and support your feet. Wear shoes that have rubber soles and low heels. Use a cane, walker, scooter, or crutches that help you move around if needed. Talk with your provider about other ways that you can decrease your risk of falls. This may include seeing a physical therapist to learn to do exercises to improve movement and strength. Where to find more information Centers for Disease Control and Prevention, [employer]: TonerPromos.no [employer] on Aging: BaseRingTones.pl National Institute on Aging: BaseRingTones.pl Contact a health care provider if: You are afraid of falling at home. You feel weak, drowsy, or dizzy at home. You fall at home. Get help right away if you: Lose consciousness or have trouble moving after a fall. Have a fall that causes a head injury. These symptoms may be an emergency. Get help right away. Call  911. Do not wait to see if the symptoms will go away. Do not drive yourself to the hospital. This information is not intended to replace advice given to you by your health care provider. Make sure you discuss any questions you have with your health care provider. Document Revised: 02/22/2022 Document Reviewed: 02/22/2022 Elsevier Patient Education  2024 [employer].

## 2024-01-19 NOTE — Progress Notes (Signed)
 Subjective:   Isaiah Brown is a 63 y.o. who presents for a Medicare Wellness preventive visit.  As a reminder, Annual Wellness Visits don't include a physical exam, and some assessments may be limited, especially if this visit is performed virtually. We may recommend an in-person follow-up visit with your provider if needed.  Visit Complete: Virtual I connected with  Isaiah Brown on 01/19/24 by a audio enabled telemedicine application and verified that I am speaking with the correct person using two identifiers.  Patient Location: Home  Provider Location: Home Office  I discussed the limitations of evaluation and management by telemedicine. The patient expressed understanding and agreed to proceed.  Vital Signs: Because this visit was a virtual/telehealth visit, some criteria may be missing or patient reported. Any vitals not documented were not able to be obtained and vitals that have been documented are patient reported.  VideoDeclined- This patient declined Librarian, academic. Therefore the visit was completed with audio only.  Persons Participating in Visit: Patient.  AWV Questionnaire: No: Patient Medicare AWV questionnaire was not completed prior to this visit.  Cardiac Risk Factors include: advanced age (>31men, >24 women);male gender;dyslipidemia;hypertension;obesity (BMI >30kg/m2);smoking/ tobacco exposure     Objective:    Today's Vitals   01/19/24 1124  Weight: 265 lb (120.2 kg)  Height: 6' 1 (1.854 m)  PainSc: 2    Body mass index is 34.96 kg/m.     01/19/2024   11:40 AM 05/12/2023    3:40 PM 10/25/2022    1:14 PM 09/21/2022    1:58 PM 01/12/2021   10:22 AM 01/02/2021    3:18 PM 08/26/2020    4:22 PM  Advanced Directives  Does Patient Have a Medical Advance Directive? No No No No No No No  Would patient like information on creating a medical advance directive? Yes (MAU/Ambulatory/Procedural Areas - Information given)  No - Patient  declined No - Patient declined Yes (Inpatient - patient defers creating a medical advance directive at this time - Information given) No - Patient declined     Current Medications (verified) Outpatient Encounter Medications as of 01/19/2024  Medication Sig   acetaminophen  (TYLENOL ) 500 MG tablet Take 2,000 mg by mouth every 6 (six) hours as needed for moderate pain.   albuterol  (PROVENTIL ) (2.5 MG/3ML) 0.083% nebulizer solution Take 3 mLs (2.5 mg total) by nebulization every 6 (six) hours as needed for wheezing or shortness of breath.   albuterol  (VENTOLIN  HFA) 108 (90 Base) MCG/ACT inhaler Inhale 2 puffs into the lungs every 6 (six) hours as needed for wheezing or shortness of breath.   buPROPion  (WELLBUTRIN  XL) 150 MG 24 hr tablet Take 1 tablet (150 mg total) by mouth daily.   levothyroxine  (SYNTHROID ) 150 MCG tablet Take 1 tablet (150 mcg total) by mouth daily.   meloxicam  (MOBIC ) 7.5 MG tablet Take 1 tablet (7.5 mg total) by mouth daily.   Multiple Vitamins-Minerals (MULTIVITAMIN WITH MINERALS) tablet Take 1 tablet by mouth daily.   rosuvastatin  (CRESTOR ) 10 MG tablet Take 1 tablet (10 mg total) by mouth daily.   Tiotropium Bromide Monohydrate  (SPIRIVA  RESPIMAT) 2.5 MCG/ACT AERS Inhale 2 puffs into the lungs daily. (Patient taking differently: Inhale 2 puffs into the lungs daily. PRN)   valsartan -hydrochlorothiazide  (DIOVAN -HCT) 320-25 MG tablet Take 1 tablet by mouth daily.   acetaZOLAMIDE (DIAMOX) 250 MG tablet Take 250 mg by mouth 2 (two) times daily. (Patient not taking: Reported on 01/19/2024)   No facility-administered encounter medications on file as of  01/19/2024.    Allergies (verified) Other   History: Past Medical History:  Diagnosis Date   Anxiety    Aortic atherosclerosis (HCC)    Chronic radicular lumbar pain    COPD (chronic obstructive pulmonary disease) (HCC)    Hyperlipidemia    Hypertension    Hypothyroidism    Sleep apnea    not using CPAP at this time   Past  Surgical History:  Procedure Laterality Date   APPENDECTOMY     CHOLECYSTECTOMY     CYSTOSCOPY WITH URETHRAL DILATATION N/A 01/12/2021   Procedure: CYSTOSCOPY WITH URETHRAL DILATATION WITH OPTILUME BALLOON;  Surgeon: Penne Knee, MD;  Location: [facility] ORS;  Service: Urology;  Laterality: N/A;   HERNIA REPAIR     MOUTH SURGERY Left    Cyst on gum   SKIN CANCER EXCISION Left 2018   [place]   Family History  Adopted: Yes  Family history unknown: Yes   Social History   Socioeconomic History   Marital status: Single    Spouse name: Not on file   Number of children: 0   Years of education: Not on file   Highest education level: Not on file  Occupational History   Occupation: disability, retired  Tobacco Use   Smoking status: Every Day    Current packs/day: 0.00    Average packs/day: 1.5 packs/day for 47.0 years (70.5 ttl pk-yrs)    Types: Cigars, Cigarettes    Start date: 01/02/1977    Last attempt to quit: 01/12/2024    Years since quitting: 0.0    Passive exposure: Current   Smokeless tobacco: Never  Vaping Use   Vaping status: Former   Start date: 01/19/2019   Quit date: 01/19/2020   Substances: Nicotine , Flavoring  Substance and Sexual Activity   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   Drug use: Never   Sexual activity: Yes  Other Topics Concern   Not on file  Social History Narrative   Not on file   Social Drivers of Health   Financial Resource Strain: Low Risk  (01/19/2024)   Overall Financial Resource Strain (CARDIA)    Difficulty of Paying Living Expenses: Not hard at all  Food Insecurity: No Food Insecurity (01/19/2024)   Hunger Vital Sign    Worried About Running Out of Food in the Last Year: Never true    Ran Out of Food in the Last Year: Never true  Transportation Needs: No Transportation Needs (01/19/2024)   PRAPARE - Administrator, Civil Service (Medical): No    Lack of Transportation (Non-Medical): No  Physical  Activity: Inactive (01/19/2024)   Exercise Vital Sign    Days of Exercise per Week: 0 days    Minutes of Exercise per Session: 0 min  Stress: No Stress Concern Present (01/19/2024)   [employer] of Occupational Health - Occupational Stress Questionnaire    Feeling of Stress: Only a little  Social Connections: Socially Isolated (01/19/2024)   Social Connection and Isolation Panel    Frequency of Communication with Friends and Family: More than three times a week    Frequency of Social Gatherings with Friends and Family: Once a week    Attends Religious Services: Never    Database administrator or Organizations: No    Attends Banker Meetings: Never    Marital Status: Never married    Tobacco Counseling Ready to quit: Not Answered Counseling given: Not Answered  Clinical Intake:  Pre-visit preparation completed: Yes  Pain : 0-10 Pain Score: 2  Pain Type: Chronic pain Pain Location: Back Pain Descriptors / Indicators: Aching     BMI - recorded: 34.96 Nutritional Status: BMI > 30  Obese Nutritional Risks: None Diabetes: No  Lab Results  Component Value Date   HGBA1C 5.2 01/19/2021   HGBA1C 5.0 10/01/2020     How often do you need to have someone help you when you read instructions, pamphlets, or other written materials from your doctor or pharmacy?: 1 - Never  Interpreter Needed?: No  Information entered by :: Vina Ned, CMA   Activities of Daily Living     01/19/2024   11:26 AM  In your present state of health, do you have any difficulty performing the following activities:  Hearing? 0  Vision? 0  Difficulty concentrating or making decisions? 0  Walking or climbing stairs? 1  Comment uses cane prn  Dressing or bathing? 0  Doing errands, shopping? 0  Preparing Food and eating ? N  Using the Toilet? N  In the past six months, have you accidently leaked urine? N  Do you have problems with loss of bowel control? N  Managing your  Medications? N  Managing your Finances? N  Housekeeping or managing your Housekeeping? N    Patient Care Team: Melvin Pao, NP as PCP - General (Nurse Practitioner) Rennie Cindy SAUNDERS, MD as Consulting Physician (Oncology) Enola Feliciano Hugger, MD as Consulting Physician (Ophthalmology)  I have updated your Care Teams any recent Medical Services you may have received from other providers in the past year.     Assessment:   This is a routine wellness examination for Isaiah Brown.  Hearing/Vision screen Hearing Screening - Comments:: Denies hearing loss  Vision Screening - Comments:: Gets routine eye exams, Dr. Feliciano Enola @ [facility], [place]   Goals Addressed               This Visit's Progress     Quit Smoking (pt-stated)        Continue to not smoke       Depression Screen     01/19/2024   11:38 AM 01/18/2024    1:24 PM 09/05/2023    1:15 PM 04/20/2023    1:23 PM 09/21/2022    2:06 PM 09/16/2022    1:43 PM 04/20/2022   11:12 AM  PHQ 2/9 Scores  PHQ - 2 Score 0 0 0 0 0  0  PHQ- 9 Score 0 0 0 0   0  Exception Documentation      Patient refusal     Fall Risk     01/19/2024   11:44 AM 01/18/2024    1:24 PM 09/05/2023    1:15 PM 04/20/2023    1:23 PM 09/16/2022    1:43 PM  Fall Risk   Falls in the past year? 0 0 0 0 0  Number falls in past yr: 0 0 1 0 0  Injury with Fall? 0 0 1 0 0  Risk for fall due to : Impaired balance/gait;Orthopedic patient;Impaired mobility No Fall Risks No Fall Risks No Fall Risks No Fall Risks  Follow up Falls evaluation completed;Education provided Falls evaluation completed Falls evaluation completed Falls evaluation completed Falls evaluation completed    MEDICARE RISK AT HOME:  Medicare Risk at Home Any stairs in or around the home?: Yes If so, are there any without handrails?: No Home free of loose throw rugs in walkways,  pet beds, electrical cords, etc?: Yes Adequate lighting in your home to reduce risk of falls?:  Yes Life alert?: No Use of a cane, walker or w/c?: Yes (uses cane prn) Grab bars in the bathroom?: No Shower chair or bench in shower?: No Elevated toilet seat or a handicapped toilet?: No  TIMED UP AND GO:  Was the test performed?  No  Cognitive Function: 6CIT completed        01/19/2024   11:45 AM  6CIT Screen  What Year? 0 points  What month? 0 points  What time? 0 points  Count back from 20 0 points  Months in reverse 0 points  Repeat phrase 0 points  Total Score 0 points    Immunizations Immunization History  Administered Date(s) Administered   Influenza, Seasonal, Injecte, Preservative Fre 04/20/2023   Influenza,inj,Quad PF,6+ Mos 04/28/2021, 04/20/2022   Moderna Sars-Covid-2 Vaccination 04/15/2020, 05/22/2020, 12/22/2020   PNEUMOCOCCAL CONJUGATE-20 09/16/2022   Pfizer Covid-19 Vaccine Bivalent Booster 26yrs & up 06/02/2021   Rsv, Bivalent, Protein Subunit Rsvpref,pf [place]) 06/20/2023   Zoster Recombinant(Shingrix ) 04/20/2022, 03/16/2023    Screening Tests Health Maintenance  Topic Date Due   Lung Cancer Screening  11/14/2021   COVID-19 Vaccine (5 - 2024-25 season) 03/06/2023   DTaP/Tdap/Td (1 - Tdap) 04/19/2024 (Originally 10/04/1979)   INFLUENZA VACCINE  02/03/2024   Medicare Annual Wellness (AWV)  01/18/2025   Fecal DNA (Cologuard)  10/05/2025   Pneumococcal Vaccine: 50+ Years  Completed   Hepatitis C Screening  Completed   HIV Screening  Completed   Zoster Vaccines- Shingrix   Completed   Hepatitis B Vaccines  Aged Out   HPV VACCINES  Aged Out   Meningococcal B Vaccine  Aged Out    Health Maintenance  Health Maintenance Due  Topic Date Due   Lung Cancer Screening  11/14/2021   COVID-19 Vaccine (5 - 2024-25 season) 03/06/2023   Health Maintenance Items Addressed: Referral sent to GI for colonoscopy, LDCT ordered for lung cancer screening  Additional Screening:  Vision Screening: Recommended annual ophthalmology exams for early detection of  glaucoma and other disorders of the eye. Would you like a referral to an eye doctor? No    Dental Screening: Recommended annual dental exams for proper oral hygiene  Community Resource Referral / Chronic Care Management: CRR required this visit?  No   CCM required this visit?  No   Plan:    I have personally reviewed and noted the following in the patient's chart:   Medical and social history Use of alcohol, tobacco or illicit drugs  Current medications and supplements including opioid prescriptions. Patient is not currently taking opioid prescriptions. Functional ability and status Nutritional status Physical activity Advanced directives List of other physicians Hospitalizations, surgeries, and ER visits in previous 12 months Vitals Screenings to include cognitive, depression, and falls Referrals and appointments  In addition, I have reviewed and discussed with patient certain preventive protocols, quality metrics, and best practice recommendations. A written personalized care plan for preventive services as well as general preventive health recommendations were provided to patient.   Vina Ned, CMA   01/19/2024   After Visit Summary: (MyChart) Due to this being a telephonic visit, the after visit summary with patients personalized plan was offered to patient via MyChart   Notes:  Placed referral for colonoscopy (+cologuard 10/06/22) Placed order for LDCT scan (last done 11/14/20) Needs Tdap (pharmacy) Get Covid and flu vaccines in the fall

## 2024-01-20 ENCOUNTER — Other Ambulatory Visit

## 2024-01-20 DIAGNOSIS — I10 Essential (primary) hypertension: Secondary | ICD-10-CM | POA: Diagnosis not present

## 2024-01-20 DIAGNOSIS — E039 Hypothyroidism, unspecified: Secondary | ICD-10-CM | POA: Diagnosis not present

## 2024-01-20 DIAGNOSIS — E785 Hyperlipidemia, unspecified: Secondary | ICD-10-CM | POA: Diagnosis not present

## 2024-01-21 LAB — COMPREHENSIVE METABOLIC PANEL WITH GFR
ALT: 16 IU/L (ref 0–44)
AST: 17 IU/L (ref 0–40)
Albumin: 4.4 g/dL (ref 3.9–4.9)
Alkaline Phosphatase: 72 IU/L (ref 44–121)
BUN/Creatinine Ratio: 23 (ref 10–24)
BUN: 25 mg/dL (ref 8–27)
Bilirubin Total: 0.4 mg/dL (ref 0.0–1.2)
CO2: 22 mmol/L (ref 20–29)
Calcium: 9.7 mg/dL (ref 8.6–10.2)
Chloride: 102 mmol/L (ref 96–106)
Creatinine, Ser: 1.11 mg/dL (ref 0.76–1.27)
Globulin, Total: 2.4 g/dL (ref 1.5–4.5)
Glucose: 145 mg/dL — ABNORMAL HIGH (ref 70–99)
Potassium: 3.4 mmol/L — ABNORMAL LOW (ref 3.5–5.2)
Sodium: 140 mmol/L (ref 134–144)
Total Protein: 6.8 g/dL (ref 6.0–8.5)
eGFR: 74 mL/min/1.73 (ref >59)

## 2024-01-21 LAB — LIPID PANEL
Chol/HDL Ratio: 3.2 ratio (ref 0.0–5.0)
Cholesterol, Total: 132 mg/dL (ref 100–199)
HDL: 41 mg/dL (ref >39)
LDL Chol Calc (NIH): 61 mg/dL (ref 0–99)
Triglycerides: 178 mg/dL — ABNORMAL HIGH (ref 0–149)
VLDL Cholesterol Cal: 30 mg/dL (ref 5–40)

## 2024-01-21 LAB — TSH: TSH: 2.07 u[IU]/mL (ref 0.450–4.500)

## 2024-01-21 LAB — T4: T4, Total: 8.4 ug/dL (ref 4.5–12.0)

## 2024-01-23 ENCOUNTER — Ambulatory Visit: Payer: Self-pay | Admitting: Nurse Practitioner

## 2024-01-23 DIAGNOSIS — R7309 Other abnormal glucose: Secondary | ICD-10-CM

## 2024-01-24 ENCOUNTER — Other Ambulatory Visit

## 2024-01-25 ENCOUNTER — Other Ambulatory Visit

## 2024-01-25 ENCOUNTER — Other Ambulatory Visit: Payer: Self-pay

## 2024-01-25 DIAGNOSIS — R7309 Other abnormal glucose: Secondary | ICD-10-CM

## 2024-01-25 DIAGNOSIS — Z87891 Personal history of nicotine dependence: Secondary | ICD-10-CM

## 2024-01-25 DIAGNOSIS — Z122 Encounter for screening for malignant neoplasm of respiratory organs: Secondary | ICD-10-CM

## 2024-01-25 DIAGNOSIS — F17210 Nicotine dependence, cigarettes, uncomplicated: Secondary | ICD-10-CM

## 2024-01-26 ENCOUNTER — Ambulatory Visit: Payer: Self-pay | Admitting: Nurse Practitioner

## 2024-01-26 ENCOUNTER — Other Ambulatory Visit

## 2024-01-26 LAB — COMPREHENSIVE METABOLIC PANEL WITH GFR
ALT: 21 IU/L (ref 0–44)
AST: 22 IU/L (ref 0–40)
Albumin: 4.4 g/dL (ref 3.9–4.9)
Alkaline Phosphatase: 71 IU/L (ref 44–121)
BUN/Creatinine Ratio: 15 (ref 10–24)
BUN: 18 mg/dL (ref 8–27)
Bilirubin Total: 0.3 mg/dL (ref 0.0–1.2)
CO2: 23 mmol/L (ref 20–29)
Calcium: 9.4 mg/dL (ref 8.6–10.2)
Chloride: 101 mmol/L (ref 96–106)
Creatinine, Ser: 1.18 mg/dL (ref 0.76–1.27)
Globulin, Total: 2.5 g/dL (ref 1.5–4.5)
Glucose: 86 mg/dL (ref 70–99)
Potassium: 4.1 mmol/L (ref 3.5–5.2)
Sodium: 142 mmol/L (ref 134–144)
Total Protein: 6.9 g/dL (ref 6.0–8.5)
eGFR: 68 mL/min/1.73 (ref >59)

## 2024-01-26 LAB — HEMOGLOBIN A1C
Est. average glucose Bld gHb Est-mCnc: 111 mg/dL
Hgb A1c MFr Bld: 5.5 % (ref 4.8–5.6)

## 2024-03-15 DIAGNOSIS — Z9889 Other specified postprocedural states: Secondary | ICD-10-CM | POA: Diagnosis not present

## 2024-03-15 DIAGNOSIS — H353121 Nonexudative age-related macular degeneration, left eye, early dry stage: Secondary | ICD-10-CM | POA: Diagnosis not present

## 2024-03-15 DIAGNOSIS — H353111 Nonexudative age-related macular degeneration, right eye, early dry stage: Secondary | ICD-10-CM | POA: Diagnosis not present

## 2024-03-15 DIAGNOSIS — H2513 Age-related nuclear cataract, bilateral: Secondary | ICD-10-CM | POA: Diagnosis not present

## 2024-03-29 ENCOUNTER — Ambulatory Visit: Admitting: Orthopedic Surgery

## 2024-05-03 ENCOUNTER — Telehealth: Payer: Self-pay | Admitting: Nurse Practitioner

## 2024-05-03 ENCOUNTER — Other Ambulatory Visit: Payer: Self-pay | Admitting: Nurse Practitioner

## 2024-05-03 NOTE — Telephone Encounter (Signed)
 Letter written for patient.

## 2024-05-03 NOTE — Telephone Encounter (Signed)
 Would you be willing to write a jury duty excuse letter for the patient? If so, will call and get more info for the letter.

## 2024-05-03 NOTE — Telephone Encounter (Unsigned)
 Copied from CRM [id number]. Topic: General - Other >> May 03, 2024  1:23 PM Charlet HERO wrote: Reason for CRM: Patient is calling about how to get Dr Magdalene for missing jury duty. He is stating that he is disabled. He is stating that he has until next Monday.

## 2024-05-04 NOTE — Telephone Encounter (Signed)
 Requested Prescriptions  Pending Prescriptions Disp Refills   meloxicam  (MOBIC ) 7.5 MG tablet [Pharmacy Med Name: MELOXICAM  7.5 MG TABLET] 90 tablet 0    Sig: Take 1 tablet (7.5 mg total) by mouth daily.     Analgesics:  COX2 Inhibitors Failed - 05/04/2024  4:22 PM      Failed - Manual Review: Labs are only required if the patient has taken medication for more than 8 weeks.      Failed - HGB in normal range and within 360 days    Hemoglobin  Date Value Ref Range Status  06/20/2023 18.5 (H) 13.0 - 17.7 g/dL Final         Failed - HCT in normal range and within 360 days    Hematocrit  Date Value Ref Range Status  06/20/2023 55.0 (H) 37.5 - 51.0 % Final         Passed - Cr in normal range and within 360 days    Creatinine, Ser  Date Value Ref Range Status  01/25/2024 1.18 0.76 - 1.27 mg/dL Final         Passed - AST in normal range and within 360 days    AST  Date Value Ref Range Status  01/25/2024 22 0 - 40 IU/L Final         Passed - ALT in normal range and within 360 days    ALT  Date Value Ref Range Status  01/25/2024 21 0 - 44 IU/L Final         Passed - eGFR is 30 or above and within 360 days    GFR, Estimated  Date Value Ref Range Status  05/12/2023 >60 >60 mL/min Final    Comment:    (NOTE) Calculated using the CKD-EPI Creatinine Equation (2021)    eGFR  Date Value Ref Range Status  01/25/2024 68 >59 mL/min/1.73 Final         Passed - Patient is not pregnant      Passed - Valid encounter within last 12 months    Recent Outpatient Visits           3 months ago Aortic atherosclerosis   [place] [facility] Melvin Pao, NP   8 months ago Primary hypertension   [place] [facility] Melvin Pao, NP

## 2024-05-04 NOTE — Telephone Encounter (Signed)
 Letter placed up front to be picked up by the patient.

## 2024-05-04 NOTE — Telephone Encounter (Signed)
 Pt will be there to pick up on Monday

## 2024-05-07 ENCOUNTER — Encounter: Payer: Self-pay | Admitting: Radiology

## 2024-05-16 ENCOUNTER — Other Ambulatory Visit: Payer: Self-pay | Admitting: Nurse Practitioner

## 2024-05-18 NOTE — Telephone Encounter (Signed)
 Duplicate request, too soon for refill.  Requested Prescriptions  Pending Prescriptions Disp Refills   meloxicam  (MOBIC ) 7.5 MG tablet [Pharmacy Med Name: MELOXICAM  7.5 MG TABLET] 30 tablet 0    Sig: Take 1 tablet (7.5 mg total) by mouth daily.     Analgesics:  COX2 Inhibitors Failed - 05/18/2024 11:16 AM      Failed - Manual Review: Labs are only required if the patient has taken medication for more than 8 weeks.      Failed - HGB in normal range and within 360 days    Hemoglobin  Date Value Ref Range Status  06/20/2023 18.5 (H) 13.0 - 17.7 g/dL Final         Failed - HCT in normal range and within 360 days    Hematocrit  Date Value Ref Range Status  06/20/2023 55.0 (H) 37.5 - 51.0 % Final         Passed - Cr in normal range and within 360 days    Creatinine, Ser  Date Value Ref Range Status  01/25/2024 1.18 0.76 - 1.27 mg/dL Final         Passed - AST in normal range and within 360 days    AST  Date Value Ref Range Status  01/25/2024 22 0 - 40 IU/L Final         Passed - ALT in normal range and within 360 days    ALT  Date Value Ref Range Status  01/25/2024 21 0 - 44 IU/L Final         Passed - eGFR is 30 or above and within 360 days    GFR, Estimated  Date Value Ref Range Status  05/12/2023 >60 >60 mL/min Final    Comment:    (NOTE) Calculated using the CKD-EPI Creatinine Equation (2021)    eGFR  Date Value Ref Range Status  01/25/2024 68 >59 mL/min/1.73 Final         Passed - Patient is not pregnant      Passed - Valid encounter within last 12 months    Recent Outpatient Visits           4 months ago Aortic atherosclerosis   [place] [facility] Melvin Pao, NP   8 months ago Primary hypertension   [place] [facility] Melvin Pao, NP

## 2024-07-09 ENCOUNTER — Other Ambulatory Visit: Payer: Self-pay | Admitting: Nurse Practitioner

## 2024-07-10 NOTE — Telephone Encounter (Signed)
 Requested Prescriptions  Pending Prescriptions Disp Refills   rosuvastatin  (CRESTOR ) 10 MG tablet [Pharmacy Med Name: ROSUVASTATIN  CALCIUM  10 MG TAB] 90 tablet 0    Sig: Take 1 tablet (10 mg total) by mouth daily.     Cardiovascular:  Antilipid - Statins 2 Failed - 07/10/2024  2:37 PM      Failed - Lipid Panel in normal range within the last 12 months    Cholesterol, Total  Date Value Ref Range Status  01/20/2024 132 100 - 199 mg/dL Final   LDL Chol Calc (NIH)  Date Value Ref Range Status  01/20/2024 61 0 - 99 mg/dL Final   HDL  Date Value Ref Range Status  01/20/2024 41 >39 mg/dL Final   Triglycerides  Date Value Ref Range Status  01/20/2024 178 (H) 0 - 149 mg/dL Final         Passed - Cr in normal range and within 360 days    Creatinine, Ser  Date Value Ref Range Status  01/25/2024 1.18 0.76 - 1.27 mg/dL Final         Passed - Patient is not pregnant      Passed - Valid encounter within last 12 months    Recent Outpatient Visits           5 months ago Aortic atherosclerosis   [place] [facility] Melvin Pao, NP   10 months ago Primary hypertension   [place] [facility] Melvin Pao, NP              Refused Prescriptions Disp Refills   meloxicam  (MOBIC ) 7.5 MG tablet [Pharmacy Med Name: MELOXICAM  7.5 MG TABLET] 30 tablet 0    Sig: Take 1 tablet (7.5 mg total) by mouth daily.     Analgesics:  COX2 Inhibitors Failed - 07/10/2024  2:37 PM      Failed - Manual Review: Labs are only required if the patient has taken medication for more than 8 weeks.      Failed - HGB in normal range and within 360 days    Hemoglobin  Date Value Ref Range Status  06/20/2023 18.5 (H) 13.0 - 17.7 g/dL Final         Failed - HCT in normal range and within 360 days    Hematocrit  Date Value Ref Range Status  06/20/2023 55.0 (H) 37.5 - 51.0 % Final         Passed - Cr in normal range and within 360 days    Creatinine, Ser  Date  Value Ref Range Status  01/25/2024 1.18 0.76 - 1.27 mg/dL Final         Passed - AST in normal range and within 360 days    AST  Date Value Ref Range Status  01/25/2024 22 0 - 40 IU/L Final         Passed - ALT in normal range and within 360 days    ALT  Date Value Ref Range Status  01/25/2024 21 0 - 44 IU/L Final         Passed - eGFR is 30 or above and within 360 days    GFR, Estimated  Date Value Ref Range Status  05/12/2023 >60 >60 mL/min Final    Comment:    (NOTE) Calculated using the CKD-EPI Creatinine Equation (2021)    eGFR  Date Value Ref Range Status  01/25/2024 68 >59 mL/min/1.73 Final         Passed - Patient is  not pregnant      Passed - Valid encounter within last 12 months    Recent Outpatient Visits           5 months ago Aortic atherosclerosis   [place] [facility] Melvin Pao, NP   10 months ago Primary hypertension   [place] [facility] Melvin Pao, NP

## 2024-07-24 ENCOUNTER — Ambulatory Visit: Admitting: Nurse Practitioner

## 2024-07-24 ENCOUNTER — Encounter: Payer: Self-pay | Admitting: Nurse Practitioner

## 2024-07-24 VITALS — BP 131/86 | HR 71 | Temp 95.5°F | Ht 72.44 in | Wt 276.2 lb

## 2024-07-24 DIAGNOSIS — F17200 Nicotine dependence, unspecified, uncomplicated: Secondary | ICD-10-CM

## 2024-07-24 DIAGNOSIS — E785 Hyperlipidemia, unspecified: Secondary | ICD-10-CM | POA: Diagnosis not present

## 2024-07-24 DIAGNOSIS — E039 Hypothyroidism, unspecified: Secondary | ICD-10-CM

## 2024-07-24 DIAGNOSIS — Z0000 Encounter for general adult medical examination without abnormal findings: Secondary | ICD-10-CM | POA: Diagnosis not present

## 2024-07-24 DIAGNOSIS — I700 Atherosclerosis of aorta: Secondary | ICD-10-CM | POA: Diagnosis not present

## 2024-07-24 DIAGNOSIS — I10 Essential (primary) hypertension: Secondary | ICD-10-CM | POA: Diagnosis not present

## 2024-07-24 DIAGNOSIS — J439 Emphysema, unspecified: Secondary | ICD-10-CM | POA: Diagnosis not present

## 2024-07-24 MED ORDER — LEVOTHYROXINE SODIUM 150 MCG PO TABS: 150.0000 ug | ORAL_TABLET | Freq: Every day | ORAL | 1 refills | Status: AC

## 2024-07-24 MED ORDER — SPIRIVA RESPIMAT 2.5 MCG/ACT IN AERS: 2.0000 | INHALATION_SPRAY | Freq: Every day | RESPIRATORY_TRACT | 1 refills | Status: AC

## 2024-07-24 MED ORDER — ROSUVASTATIN CALCIUM 10 MG PO TABS: 10.0000 mg | ORAL_TABLET | Freq: Every day | ORAL | 1 refills | Status: AC

## 2024-07-24 MED ORDER — MELOXICAM 7.5 MG PO TABS: 7.5000 mg | ORAL_TABLET | Freq: Every day | ORAL | 0 refills | Status: AC

## 2024-07-24 MED ORDER — BUPROPION HCL ER (XL) 150 MG PO TB24: 150.0000 mg | ORAL_TABLET | Freq: Every day | ORAL | 1 refills | Status: AC

## 2024-07-24 MED ORDER — VALSARTAN-HYDROCHLOROTHIAZIDE 320-25 MG PO TABS: 1.0000 | ORAL_TABLET | Freq: Every day | ORAL | 1 refills | Status: AC

## 2024-07-24 NOTE — Assessment & Plan Note (Signed)
 Chronic.  Will increase dose of Valsartan  320mg  and hydrochlorothiazide  25mg .  Refills sent today.  Labs ordered at visit today.  Follow up in 6 months.  Call sooner if concerns arise.

## 2024-07-24 NOTE — Assessment & Plan Note (Addendum)
 Chronic, historic condition Has quit smoking.  Last Cigarette was July 10.  No longer using Lozenges. He denies concerns today  He is taking Spiriva  2.5 mcg as directed and has rescue inhaler as needed Continue with current regimen Follow up in 6 months or sooner if concerns arise

## 2024-07-24 NOTE — Assessment & Plan Note (Signed)
 No longer smoking.  He quit July 10.  No longer using the Lozenges either.

## 2024-07-24 NOTE — Progress Notes (Signed)
 "  BP 131/86 (BP Location: Left Wrist, Patient Position: Sitting, Cuff Size: Normal)   Pulse 71   Temp (!) 95.5 F (35.3 C) (Oral)   Ht 6' 0.44 (1.84 m)   Wt 276 lb 3.2 oz (125.3 kg)   SpO2 95%   BMI 37.00 kg/m    Subjective:    Patient ID: Isaiah Brown, male    DOB: 08/01/1960, 64 y.o.   MRN: [id number]  HPI: Isaiah Brown is a 64 y.o. male presenting on 07/24/2024 for comprehensive medical examination. Current medical complaints include:none  He currently lives with: Interim Problems from his last visit: no  HYPERTENSION without Chronic Kidney Disease Hypertension status: controlled  Satisfied with current treatment? no Duration of hypertension: years BP monitoring frequency:  not checking BP range:  BP medication side effects:  no Medication compliance: excellent compliance Previous BP meds:valsartan -HCTZ Aspirin: no Recurrent headaches: no Visual changes: no Palpitations: no Dyspnea: no Chest pain: no Lower extremity edema: no Dizzy/lightheaded: no  HYPOTHYROIDISM Thyroid  control status:controlled Satisfied with current treatment? yes Medication side effects: no Medication compliance: excellent compliance Etiology of hypothyroidism:  Recent dose adjustment:no Fatigue: no Cold intolerance: no Heat intolerance: no Weight gain: no Weight loss: no Constipation: no Diarrhea/loose stools: no Palpitations: no Lower extremity edema: no Anxiety/depressed mood: no  He quit smoking on July 10.  He is also off the lozenges.  He can take a deep breath and has more stamina.    Depression Screen done today and results listed below:     07/24/2024    2:01 PM 01/19/2024   11:38 AM 01/18/2024    1:24 PM 09/05/2023    1:15 PM 04/20/2023    1:23 PM  Depression screen PHQ 2/9  Decreased Interest 0 0 0 0 0  Down, Depressed, Hopeless 0 0 0 0 0  PHQ - 2 Score 0 0 0 0 0  Altered sleeping 0 0 0 0 0  Tired, decreased energy 0 0 0 0 0  Change in appetite 0 0 0 0 0   Feeling bad or failure about yourself  0 0 0 0 0  Trouble concentrating 0 0 0 0 0  Moving slowly or fidgety/restless 0 0 0 0 0  Suicidal thoughts 0 0 0 0 0  PHQ-9 Score 0 0  0  0  0   Difficult doing work/chores Not difficult at all Not difficult at all Not difficult at all  Not difficult at all     Data saved with a previous flowsheet row definition    The patient does not have a history of falls. I did complete a risk assessment for falls. A plan of care for falls was documented.   Past Medical History:  Past Medical History:  Diagnosis Date   Anxiety    Aortic atherosclerosis    Chronic radicular lumbar pain    COPD (chronic obstructive pulmonary disease) (HCC)    Hyperlipidemia    Hypertension    Hypothyroidism    Sleep apnea    not using CPAP at this time    Surgical History:  Past Surgical History:  Procedure Laterality Date   APPENDECTOMY     CHOLECYSTECTOMY     CYSTOSCOPY WITH URETHRAL DILATATION N/A 01/12/2021   Procedure: CYSTOSCOPY WITH URETHRAL DILATATION WITH OPTILUME BALLOON;  Surgeon: Penne Knee, MD;  Location: [facility] ORS;  Service: Urology;  Laterality: N/A;   HERNIA REPAIR     MOUTH SURGERY Left    Cyst on gum   SKIN  CANCER EXCISION Left 2018   [place]    Medications:  Current Outpatient Medications on File Prior to Visit  Medication Sig   acetaminophen  (TYLENOL ) 500 MG tablet Take 2,000 mg by mouth every 6 (six) hours as needed for moderate pain.   albuterol  (PROVENTIL ) (2.5 MG/3ML) 0.083% nebulizer solution Take 3 mLs (2.5 mg total) by nebulization every 6 (six) hours as needed for wheezing or shortness of breath.   albuterol  (VENTOLIN  HFA) 108 (90 Base) MCG/ACT inhaler Inhale 2 puffs into the lungs every 6 (six) hours as needed for wheezing or shortness of breath.   Multiple Vitamins-Minerals (MULTIVITAMIN WITH MINERALS) tablet Take 1 tablet by mouth daily.   No current facility-administered medications on file prior to visit.    Allergies:   Allergies[1]  Social History:  Social History   Socioeconomic History   Marital status: Single    Spouse name: Not on file   Number of children: 0   Years of education: Not on file   Highest education level: Not on file  Occupational History   Occupation: disability, retired  Tobacco Use   Smoking status: Every Day    Current packs/day: 0.00    Average packs/day: 1.5 packs/day for 47.0 years (70.5 ttl pk-yrs)    Types: Cigars, Cigarettes    Start date: 01/02/1977    Last attempt to quit: 01/12/2024    Years since quitting: 0.5    Passive exposure: Current   Smokeless tobacco: Never  Vaping Use   Vaping status: Former   Start date: 01/19/2019   Quit date: 01/19/2020  Substance and Sexual Activity   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   Drug use: Never   Sexual activity: Yes  Other Topics Concern   Not on file  Social History Narrative   Not on file   Social Drivers of Health   Tobacco Use: High Risk (07/24/2024)   Patient History    Smoking Tobacco Use: Every Day    Smokeless Tobacco Use: Never    Passive Exposure: Current  Financial Resource Strain: Low Risk (01/19/2024)   Overall Financial Resource Strain (CARDIA)    Difficulty of Paying Living Expenses: Not hard at all  Food Insecurity: No Food Insecurity (01/19/2024)   Epic    Worried About Programme Researcher, Broadcasting/film/video in the Last Year: Never true    Ran Out of Food in the Last Year: Never true  Transportation Needs: No Transportation Needs (01/19/2024)   Epic    Lack of Transportation (Medical): No    Lack of Transportation (Non-Medical): No  Physical Activity: Inactive (01/19/2024)   Exercise Vital Sign    Days of Exercise per Week: 0 days    Minutes of Exercise per Session: 0 min  Stress: No Stress Concern Present (01/19/2024)   [employer] of Occupational Health - Occupational Stress Questionnaire    Feeling of Stress: Only a little  Social Connections: Socially Isolated (01/19/2024)    Social Connection and Isolation Panel    Frequency of Communication with Friends and Family: More than three times a week    Frequency of Social Gatherings with Friends and Family: Once a week    Attends Religious Services: Never    Database Administrator or Organizations: No    Attends Banker Meetings: Never    Marital Status: Never married  Intimate Partner Violence: Not At Risk (01/19/2024)   Epic    Fear of Current or Ex-Partner:  No    Emotionally Abused: No    Physically Abused: No    Sexually Abused: No  Depression (PHQ2-9): Low Risk (07/24/2024)   Depression (PHQ2-9)    PHQ-2 Score: 0  Alcohol Screen: Low Risk (01/19/2024)   Alcohol Screen    Last Alcohol Screening Score (AUDIT): 0  Housing: Low Risk (01/19/2024)   Epic    Unable to Pay for Housing in the Last Year: No    Number of Times Moved in the Last Year: 0    Homeless in the Last Year: No  Utilities: Not At Risk (01/19/2024)   Epic    Threatened with loss of utilities: No  Health Literacy: Adequate Health Literacy (01/19/2024)   B1300 Health Literacy    Frequency of need for help with medical instructions: Never   Tobacco Use History[2] Social History   Substance and Sexual Activity  Alcohol Use Not Currently   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   Review of Systems  Eyes:  Negative for blurred vision and double vision.  Respiratory:  Negative for shortness of breath.   Cardiovascular:  Negative for chest pain, palpitations and leg swelling.  Neurological:  Negative for dizziness and headaches.   All other ROS negative except what is listed above and in the HPI.      Objective:    BP 131/86 (BP Location: Left Wrist, Patient Position:  Sitting, Cuff Size: Normal)   Pulse 71   Temp (!) 95.5 F (35.3 C) (Oral)   Ht 6' 0.44 (1.84 m)   Wt 276 lb 3.2 oz (125.3 kg)   SpO2 95%   BMI 37.00 kg/m   Wt Readings from Last 3 Encounters:  07/24/24 276 lb 3.2 oz (125.3 kg)  01/19/24 265 lb (120.2 kg)  01/18/24 265 lb (120.2 kg)    Physical Exam Vitals and nursing note reviewed.  Constitutional:      General: He is not in acute distress.    Appearance: Normal appearance. He is not ill-appearing, toxic-appearing or diaphoretic.  HENT:     Head: Normocephalic.     Right Ear: Tympanic membrane, ear canal and external ear normal.     Left Ear: Tympanic membrane, ear canal and external ear normal.     Nose: Nose normal. No congestion or rhinorrhea.     Mouth/Throat:     Mouth: Mucous membranes are moist.  Eyes:     General:        Right eye: No discharge.        Left eye: No discharge.     Extraocular Movements: Extraocular movements intact.     Conjunctiva/sclera: Conjunctivae normal.     Pupils: Pupils are equal, round, and reactive to light.  Cardiovascular:     Rate and Rhythm: Normal rate and regular rhythm.     Heart sounds: No murmur heard. Pulmonary:     Effort: Pulmonary effort is normal. No respiratory distress.     Breath sounds: Normal breath sounds. No wheezing, rhonchi or rales.  Abdominal:     General: Abdomen is flat. Bowel sounds are normal. There is no distension.     Palpations: Abdomen is soft.     Tenderness: There is no abdominal tenderness. There is no  guarding.  Musculoskeletal:     Cervical back: Normal range of motion and neck supple.  Skin:    General: Skin is warm and dry.     Capillary Refill: Capillary refill takes less than 2 seconds.  Neurological:     General: No focal deficit present.     Mental Status: He is alert and oriented to person, place, and time.     Cranial Nerves: No cranial nerve deficit.     Motor: No weakness.     Deep Tendon Reflexes: Reflexes normal.  Psychiatric:         Mood and Affect: Mood normal.        Behavior: Behavior normal.        Thought Content: Thought content normal.        Judgment: Judgment normal.     Results for orders placed or performed in visit on 01/25/24  HgB A1c   Collection Time: 01/25/24  1:26 PM  Result Value Ref Range   Hgb A1c MFr Bld 5.5 4.8 - 5.6 %   Est. average glucose Bld gHb Est-mCnc 111 mg/dL  Comp Met (CMET)   Collection Time: 01/25/24  1:26 PM  Result Value Ref Range   Glucose 86 70 - 99 mg/dL   BUN 18 8 - 27 mg/dL   Creatinine, Ser 8.81 0.76 - 1.27 mg/dL   eGFR 68 >40 fO/fpw/8.26   BUN/Creatinine Ratio 15 10 - 24   Sodium 142 134 - 144 mmol/L   Potassium 4.1 3.5 - 5.2 mmol/L   Chloride 101 96 - 106 mmol/L   CO2 23 20 - 29 mmol/L   Calcium  9.4 8.6 - 10.2 mg/dL   Total Protein 6.9 6.0 - 8.5 g/dL   Albumin 4.4 3.9 - 4.9 g/dL   Globulin, Total 2.5 1.5 - 4.5 g/dL   Bilirubin Total 0.3 0.0 - 1.2 mg/dL   Alkaline Phosphatase 71 44 - 121 IU/L   AST 22 0 - 40 IU/L   ALT 21 0 - 44 IU/L      Assessment & Plan:   Problem List Items Addressed This Visit       Cardiovascular and Mediastinum   Primary hypertension   Chronic.  Will increase dose of Valsartan  320mg  and hydrochlorothiazide  25mg .  Refills sent today.  Labs ordered at visit today.  Follow up in 6 months.  Call sooner if concerns arise.       Relevant Medications   rosuvastatin  (CRESTOR ) 10 MG tablet   valsartan -hydrochlorothiazide  (DIOVAN -HCT) 320-25 MG tablet   Aortic atherosclerosis   Chronic.  Controlled.  Continue with current medication regimen of Atorvastatin daily.  Labs ordered today.  Return to clinic in 6 months for reevaluation.  Call sooner if concerns arise.       Relevant Medications   rosuvastatin  (CRESTOR ) 10 MG tablet   valsartan -hydrochlorothiazide  (DIOVAN -HCT) 320-25 MG tablet     Respiratory   Emphysema of lung (HCC)   Chronic, historic condition Has quit smoking.  Last Cigarette was July 10.  No longer using  Lozenges. He denies concerns today  He is taking Spiriva  2.5 mcg as directed and has rescue inhaler as needed Continue with current regimen Follow up in 6 months or sooner if concerns arise       Relevant Medications   Tiotropium Bromide (SPIRIVA  RESPIMAT) 2.5 MCG/ACT AERS     Endocrine   Hypothyroidism   Chronic, ongoing He reports he ran out of Levothyroxine  for about a week Recheck TSH and T4  today  Refills provided of Levothyroxine  Results of lab work to dictate further management Follow up in 6 months or sooner if concerns arise       Relevant Medications   levothyroxine  (SYNTHROID ) 150 MCG tablet   Other Relevant Orders   TSH   T4     Other   Hyperlipidemia   Chronic.  Controlled.  Continue with current medication regimen of Crestor .  Labs ordered today.  Return to clinic in 6 months for reevaluation.  Call sooner if concerns arise.       Relevant Medications   rosuvastatin  (CRESTOR ) 10 MG tablet   valsartan -hydrochlorothiazide  (DIOVAN -HCT) 320-25 MG tablet   Other Relevant Orders   Lipid panel   Tobacco use disorder   No longer smoking.  He quit July 10.  No longer using the Lozenges either.       Obesity, morbid (HCC)   Recommended eating smaller high protein, low fat meals more frequently and exercising 30 mins a day 5 times a week with a goal of 10-15lb weight loss in the next 3 months.       Other Visit Diagnoses       Annual physical exam    -  Primary   Health maintenance reviewed during visit today.  Labs ordered. Vaccines reviewed.  CT Lung screening and colonoscopy up to date. Has quit smoking.   Relevant Orders   TSH   PSA   Lipid panel   CBC with Differential/Platelet   Comprehensive metabolic panel with GFR   T4        Discussed aspirin prophylaxis for myocardial infarction prevention and decision was it was not indicated  LABORATORY TESTING:  Health maintenance labs ordered today as discussed above.   The natural history of prostate  cancer and ongoing controversy regarding screening and potential treatment outcomes of prostate cancer has been discussed with the patient. The meaning of a false positive PSA and a false negative PSA has been discussed. He indicates understanding of the limitations of this screening test and wishes to proceed with screening PSA testing.   IMMUNIZATIONS:   - Tdap: Tetanus vaccination status reviewed: Medicare. - Influenza: Up to date - Pneumovax: Up to date - Prevnar: Up to date - COVID: Up to date - HPV: Not applicable - Shingrix  vaccine: Up to date  SCREENING: - Colonoscopy: Needs colonoscopy- had positive cologuard. Will get referral at next visit  Discussed with patient purpose of the colonoscopy is to detect colon cancer at curable precancerous or early stages   - AAA Screening: Up to date  -Hearing Test: Not applicable  -Spirometry: Not applicable   PATIENT COUNSELING:    Sexuality: Discussed sexually transmitted diseases, partner selection, use of condoms, avoidance of unintended pregnancy  and contraceptive alternatives.   Advised to avoid cigarette smoking.  I discussed with the patient that most people either abstain from alcohol or drink within safe limits (<=14/week and <=4 drinks/occasion for males, <=7/weeks and <= 3 drinks/occasion for females) and that the risk for alcohol disorders and other health effects rises proportionally with the number of drinks per week and how often a drinker exceeds daily limits.  Discussed cessation/primary prevention of drug use and availability of treatment for abuse.   Diet: Encouraged to adjust caloric intake to maintain  or achieve ideal body weight, to reduce intake of dietary saturated fat and total fat, to limit sodium intake by avoiding high sodium foods and not adding table salt, and to maintain adequate dietary potassium  and calcium  preferably from fresh fruits, vegetables, and low-fat dairy products.    stressed the importance  of regular exercise  Injury prevention: Discussed safety belts, safety helmets, smoke detector, smoking near bedding or upholstery.   Dental health: Discussed importance of regular tooth brushing, flossing, and dental visits.   Follow up plan: NEXT PREVENTATIVE PHYSICAL DUE IN 1 YEAR. Return in about 6 months (around 01/21/2025) for HTN, HLD, DM2 FU.     [1]  Allergies Allergen Reactions   Other Rash    Nicotine  Patch  [2]  Social History Tobacco Use  Smoking Status Every Day   Current packs/day: 0.00   Average packs/day: 1.5 packs/day for 47.0 years (70.5 ttl pk-yrs)   Types: Cigars, Cigarettes   Start date: 01/02/1977   Last attempt to quit: 01/12/2024   Years since quitting: 0.5   Passive exposure: Current  Smokeless Tobacco Never   "

## 2024-07-24 NOTE — Assessment & Plan Note (Signed)
Chronic.  Controlled.  Continue with current medication regimen of Crestor.  Labs ordered today.  Return to clinic in 6 months for reevaluation.  Call sooner if concerns arise.   

## 2024-07-24 NOTE — Assessment & Plan Note (Signed)
 Recommended eating smaller high protein, low fat meals more frequently and exercising 30 mins a day 5 times a week with a goal of 10-15lb weight loss in the next 3 months.

## 2024-07-24 NOTE — Assessment & Plan Note (Signed)
 Chronic.  Controlled.  Continue with current medication regimen of Atorvastatin daily.  Labs ordered today.  Return to clinic in 6 months for reevaluation.  Call sooner if concerns arise.

## 2024-07-24 NOTE — Assessment & Plan Note (Signed)
 Chronic, ongoing He reports he ran out of Levothyroxine  for about a week Recheck TSH and T4 today  Refills provided of Levothyroxine  Results of lab work to dictate further management Follow up in 6 months or sooner if concerns arise

## 2024-07-25 ENCOUNTER — Ambulatory Visit: Payer: Self-pay | Admitting: Nurse Practitioner

## 2024-07-25 LAB — TSH: TSH: 1.11 u[IU]/mL (ref 0.450–4.500)

## 2024-07-25 LAB — PSA: Prostate Specific Ag, Serum: 0.7 ng/mL (ref 0.0–4.0)

## 2024-07-25 LAB — COMPREHENSIVE METABOLIC PANEL WITH GFR
ALT: 23 IU/L (ref 0–44)
AST: 28 IU/L (ref 0–40)
Albumin: 4.5 g/dL (ref 3.9–4.9)
Alkaline Phosphatase: 65 IU/L (ref 47–123)
BUN/Creatinine Ratio: 17 (ref 10–24)
BUN: 20 mg/dL (ref 8–27)
Bilirubin Total: 0.4 mg/dL (ref 0.0–1.2)
CO2: 25 mmol/L (ref 20–29)
Calcium: 10.1 mg/dL (ref 8.6–10.2)
Chloride: 102 mmol/L (ref 96–106)
Creatinine, Ser: 1.21 mg/dL (ref 0.76–1.27)
Globulin, Total: 2.8 g/dL (ref 1.5–4.5)
Glucose: 95 mg/dL (ref 70–99)
Potassium: 4.1 mmol/L (ref 3.5–5.2)
Sodium: 144 mmol/L (ref 134–144)
Total Protein: 7.3 g/dL (ref 6.0–8.5)
eGFR: 66 mL/min/1.73

## 2024-07-25 LAB — CBC WITH DIFFERENTIAL/PLATELET
Basophils Absolute: 0.1 x10E3/uL (ref 0.0–0.2)
Basos: 1 %
EOS (ABSOLUTE): 0.3 x10E3/uL (ref 0.0–0.4)
Eos: 3 %
Hematocrit: 48.9 % (ref 37.5–51.0)
Hemoglobin: 16.5 g/dL (ref 13.0–17.7)
Immature Grans (Abs): 0.1 x10E3/uL (ref 0.0–0.1)
Immature Granulocytes: 1 %
Lymphocytes Absolute: 1.9 x10E3/uL (ref 0.7–3.1)
Lymphs: 17 %
MCH: 30.8 pg (ref 26.6–33.0)
MCHC: 33.7 g/dL (ref 31.5–35.7)
MCV: 91 fL (ref 79–97)
Monocytes Absolute: 1.1 x10E3/uL — ABNORMAL HIGH (ref 0.1–0.9)
Monocytes: 10 %
Neutrophils Absolute: 8.1 x10E3/uL — ABNORMAL HIGH (ref 1.4–7.0)
Neutrophils: 68 %
Platelets: 246 x10E3/uL (ref 150–450)
RBC: 5.35 x10E6/uL (ref 4.14–5.80)
RDW: 12.8 % (ref 11.6–15.4)
WBC: 11.6 x10E3/uL — ABNORMAL HIGH (ref 3.4–10.8)

## 2024-07-25 LAB — LIPID PANEL
Chol/HDL Ratio: 3.7 ratio (ref 0.0–5.0)
Cholesterol, Total: 185 mg/dL (ref 100–199)
HDL: 50 mg/dL
LDL Chol Calc (NIH): 86 mg/dL (ref 0–99)
Triglycerides: 299 mg/dL — ABNORMAL HIGH (ref 0–149)
VLDL Cholesterol Cal: 49 mg/dL — ABNORMAL HIGH (ref 5–40)

## 2024-07-25 LAB — T4: T4, Total: 10 ug/dL (ref 4.5–12.0)

## 2024-07-31 ENCOUNTER — Telehealth: Payer: Self-pay

## 2024-07-31 NOTE — Telephone Encounter (Signed)
 Copied from CRM #[id number]. Topic: Clinical - Medical Advice >> Jul 27, 2024 10:34 AM [name] wrote: Reason for CRM: Patient called.. wants to do ear cleaning towards the end of the week next if possible.. can he be seen by someone else if his PCP isn't available.. Pls give him a call.Isaiah Brown

## 2024-07-31 NOTE — Telephone Encounter (Signed)
 Returned call to patient. He has been using the drops and feels its helping some so he will continue to use until he is seen. He will also cancel if no longer needed.

## 2024-08-02 ENCOUNTER — Ambulatory Visit: Admitting: Family Medicine

## 2024-08-22 ENCOUNTER — Ambulatory Visit: Admitting: Nurse Practitioner

## 2025-01-22 ENCOUNTER — Ambulatory Visit: Admitting: Nurse Practitioner

## 2025-01-29 ENCOUNTER — Ambulatory Visit

## 2025-01-31 ENCOUNTER — Ambulatory Visit
